# Patient Record
Sex: Female | Born: 1940 | Race: White | Hispanic: No | Smoking: Former smoker
Health system: Southern US, Community
[De-identification: ages and names within clinical notes are randomized; demographics above are authoritative.]

## PROBLEM LIST (undated history)

## (undated) DIAGNOSIS — D649 Anemia, unspecified: Secondary | ICD-10-CM

## (undated) DIAGNOSIS — R011 Cardiac murmur, unspecified: Secondary | ICD-10-CM

## (undated) DIAGNOSIS — I639 Cerebral infarction, unspecified: Secondary | ICD-10-CM

## (undated) DIAGNOSIS — A809 Acute poliomyelitis, unspecified: Secondary | ICD-10-CM

## (undated) DIAGNOSIS — F329 Major depressive disorder, single episode, unspecified: Secondary | ICD-10-CM

## (undated) DIAGNOSIS — M1990 Unspecified osteoarthritis, unspecified site: Secondary | ICD-10-CM

## (undated) DIAGNOSIS — I10 Essential (primary) hypertension: Secondary | ICD-10-CM

## (undated) DIAGNOSIS — R112 Nausea with vomiting, unspecified: Secondary | ICD-10-CM

## (undated) DIAGNOSIS — I6522 Occlusion and stenosis of left carotid artery: Secondary | ICD-10-CM

## (undated) DIAGNOSIS — I739 Peripheral vascular disease, unspecified: Secondary | ICD-10-CM

## (undated) DIAGNOSIS — R519 Headache, unspecified: Secondary | ICD-10-CM

## (undated) DIAGNOSIS — K219 Gastro-esophageal reflux disease without esophagitis: Secondary | ICD-10-CM

## (undated) DIAGNOSIS — F32A Depression, unspecified: Secondary | ICD-10-CM

## (undated) DIAGNOSIS — Z9889 Other specified postprocedural states: Secondary | ICD-10-CM

## (undated) DIAGNOSIS — I499 Cardiac arrhythmia, unspecified: Secondary | ICD-10-CM

## (undated) DIAGNOSIS — R51 Headache: Secondary | ICD-10-CM

## (undated) DIAGNOSIS — I447 Left bundle-branch block, unspecified: Secondary | ICD-10-CM

## (undated) DIAGNOSIS — Z973 Presence of spectacles and contact lenses: Secondary | ICD-10-CM

## (undated) DIAGNOSIS — E119 Type 2 diabetes mellitus without complications: Secondary | ICD-10-CM

## (undated) DIAGNOSIS — I720 Aneurysm of carotid artery: Secondary | ICD-10-CM

## (undated) DIAGNOSIS — I2510 Atherosclerotic heart disease of native coronary artery without angina pectoris: Secondary | ICD-10-CM

## (undated) HISTORY — PX: ABDOMINAL AORTIC ANEURYSM REPAIR: SUR1152

## (undated) HISTORY — PX: VASCULAR SURGERY: SHX849

## (undated) HISTORY — PX: RENAL ARTERY STENT: SHX2321

## (undated) HISTORY — PX: COLONOSCOPY: SHX174

## (undated) HISTORY — PX: CARDIAC CATHETERIZATION: SHX172

## (undated) HISTORY — PX: DILATION AND CURETTAGE OF UTERUS: SHX78

## (undated) HISTORY — PX: HERNIA REPAIR: SHX51

## (undated) HISTORY — PX: CHOLECYSTECTOMY: SHX55

---

## 2014-09-10 ENCOUNTER — Other Ambulatory Visit (HOSPITAL_COMMUNITY): Payer: Self-pay | Admitting: Interventional Radiology

## 2014-09-10 DIAGNOSIS — I729 Aneurysm of unspecified site: Secondary | ICD-10-CM

## 2014-09-21 ENCOUNTER — Other Ambulatory Visit (HOSPITAL_COMMUNITY): Payer: Self-pay | Admitting: Interventional Radiology

## 2014-09-21 DIAGNOSIS — I729 Aneurysm of unspecified site: Secondary | ICD-10-CM

## 2014-09-22 ENCOUNTER — Ambulatory Visit (HOSPITAL_COMMUNITY)
Admission: RE | Admit: 2014-09-22 | Discharge: 2014-09-22 | Disposition: A | Discharge status: Home / Self Care | Payer: Medicare Other | Source: Ambulatory Visit | Attending: Interventional Radiology | Admitting: Interventional Radiology

## 2014-09-22 DIAGNOSIS — I729 Aneurysm of unspecified site: Secondary | ICD-10-CM

## 2014-09-23 ENCOUNTER — Other Ambulatory Visit: Payer: Self-pay | Admitting: Radiology

## 2014-09-24 ENCOUNTER — Ambulatory Visit (HOSPITAL_COMMUNITY)
Admission: RE | Admit: 2014-09-24 | Discharge: 2014-09-24 | Disposition: A | Discharge status: Home / Self Care | Payer: Medicare Other | Source: Ambulatory Visit | Attending: Interventional Radiology | Admitting: Interventional Radiology

## 2014-09-24 ENCOUNTER — Encounter (HOSPITAL_COMMUNITY): Payer: Self-pay

## 2014-09-24 ENCOUNTER — Other Ambulatory Visit (HOSPITAL_COMMUNITY): Payer: Self-pay | Admitting: Interventional Radiology

## 2014-09-24 DIAGNOSIS — I739 Peripheral vascular disease, unspecified: Secondary | ICD-10-CM | POA: Diagnosis not present

## 2014-09-24 DIAGNOSIS — I10 Essential (primary) hypertension: Secondary | ICD-10-CM | POA: Diagnosis not present

## 2014-09-24 DIAGNOSIS — Z9049 Acquired absence of other specified parts of digestive tract: Secondary | ICD-10-CM | POA: Diagnosis not present

## 2014-09-24 DIAGNOSIS — Z87891 Personal history of nicotine dependence: Secondary | ICD-10-CM | POA: Diagnosis not present

## 2014-09-24 DIAGNOSIS — I6522 Occlusion and stenosis of left carotid artery: Secondary | ICD-10-CM | POA: Insufficient documentation

## 2014-09-24 DIAGNOSIS — K219 Gastro-esophageal reflux disease without esophagitis: Secondary | ICD-10-CM | POA: Diagnosis not present

## 2014-09-24 DIAGNOSIS — I2510 Atherosclerotic heart disease of native coronary artery without angina pectoris: Secondary | ICD-10-CM | POA: Insufficient documentation

## 2014-09-24 DIAGNOSIS — I729 Aneurysm of unspecified site: Secondary | ICD-10-CM

## 2014-09-24 DIAGNOSIS — I671 Cerebral aneurysm, nonruptured: Secondary | ICD-10-CM | POA: Diagnosis present

## 2014-09-24 DIAGNOSIS — Z7982 Long term (current) use of aspirin: Secondary | ICD-10-CM | POA: Diagnosis not present

## 2014-09-24 DIAGNOSIS — M1990 Unspecified osteoarthritis, unspecified site: Secondary | ICD-10-CM | POA: Diagnosis not present

## 2014-09-24 DIAGNOSIS — E119 Type 2 diabetes mellitus without complications: Secondary | ICD-10-CM | POA: Diagnosis not present

## 2014-09-24 DIAGNOSIS — A809 Acute poliomyelitis, unspecified: Secondary | ICD-10-CM | POA: Insufficient documentation

## 2014-09-24 HISTORY — DX: Depression, unspecified: F32.A

## 2014-09-24 HISTORY — DX: Cerebral infarction, unspecified: I63.9

## 2014-09-24 HISTORY — DX: Essential (primary) hypertension: I10

## 2014-09-24 HISTORY — DX: Atherosclerotic heart disease of native coronary artery without angina pectoris: I25.10

## 2014-09-24 HISTORY — DX: Gastro-esophageal reflux disease without esophagitis: K21.9

## 2014-09-24 HISTORY — DX: Unspecified osteoarthritis, unspecified site: M19.90

## 2014-09-24 HISTORY — DX: Acute poliomyelitis, unspecified: A80.9

## 2014-09-24 HISTORY — DX: Major depressive disorder, single episode, unspecified: F32.9

## 2014-09-24 HISTORY — DX: Peripheral vascular disease, unspecified: I73.9

## 2014-09-24 HISTORY — DX: Cardiac arrhythmia, unspecified: I49.9

## 2014-09-24 HISTORY — DX: Type 2 diabetes mellitus without complications: E11.9

## 2014-09-24 LAB — CBC WITH DIFFERENTIAL/PLATELET
Basophils Absolute: 0 10*3/uL (ref 0.0–0.1)
Basophils Relative: 1 % (ref 0–1)
Eosinophils Absolute: 0.2 10*3/uL (ref 0.0–0.7)
Eosinophils Relative: 4 % (ref 0–5)
HCT: 32.5 % — ABNORMAL LOW (ref 36.0–46.0)
Hemoglobin: 10.8 g/dL — ABNORMAL LOW (ref 12.0–15.0)
Lymphocytes Relative: 33 % (ref 12–46)
Lymphs Abs: 1.7 10*3/uL (ref 0.7–4.0)
MCH: 28.3 pg (ref 26.0–34.0)
MCHC: 33.2 g/dL (ref 30.0–36.0)
MCV: 85.1 fL (ref 78.0–100.0)
Monocytes Absolute: 0.3 10*3/uL (ref 0.1–1.0)
Monocytes Relative: 7 % (ref 3–12)
Neutro Abs: 3 10*3/uL (ref 1.7–7.7)
Neutrophils Relative %: 55 % (ref 43–77)
Platelets: 121 10*3/uL — ABNORMAL LOW (ref 150–400)
RBC: 3.82 MIL/uL — ABNORMAL LOW (ref 3.87–5.11)
RDW: 14.1 % (ref 11.5–15.5)
WBC: 5.2 10*3/uL (ref 4.0–10.5)

## 2014-09-24 LAB — BASIC METABOLIC PANEL
Anion gap: 13 (ref 5–15)
Anion gap: 11 (ref 5–15)
BUN: 16 mg/dL (ref 6–23)
BUN: 14 mg/dL (ref 6–23)
CO2: 24 mEq/L (ref 19–32)
CO2: 28 mEq/L (ref 19–32)
Calcium: 8.9 mg/dL (ref 8.4–10.5)
Calcium: 8.6 mg/dL (ref 8.4–10.5)
Chloride: 106 mEq/L (ref 96–112)
Chloride: 105 mEq/L (ref 96–112)
Creatinine, Ser: 0.78 mg/dL (ref 0.50–1.10)
Creatinine, Ser: 0.76 mg/dL (ref 0.50–1.10)
GFR calc Af Amer: >90 mL/min (ref >90)
GFR calc Af Amer: >90 mL/min (ref >90)
GFR calc non Af Amer: 82 mL/min — ABNORMAL LOW (ref >90)
GFR calc non Af Amer: 83 mL/min — ABNORMAL LOW (ref >90)
Glucose, Bld: 130 mg/dL — ABNORMAL HIGH (ref 70–99)
Glucose, Bld: 113 mg/dL — ABNORMAL HIGH (ref 70–99)
Potassium: 4 mEq/L (ref 3.7–5.3)
Potassium: 3.8 mEq/L (ref 3.7–5.3)
Sodium: 143 mEq/L (ref 137–147)
Sodium: 144 mEq/L (ref 137–147)

## 2014-09-24 LAB — PROTIME-INR
INR: 0.94 (ref 0.00–1.49)
INR: 1.02 (ref 0.00–1.49)
Prothrombin Time: 12.6 seconds (ref 11.6–15.2)
Prothrombin Time: 13.4 seconds (ref 11.6–15.2)

## 2014-09-24 LAB — APTT
aPTT: 20 seconds — ABNORMAL LOW (ref 24–37)
aPTT: 38 seconds — ABNORMAL HIGH (ref 24–37)

## 2014-09-24 LAB — CBC
HCT: 32.7 % — ABNORMAL LOW (ref 36.0–46.0)
Hemoglobin: 10.8 g/dL — ABNORMAL LOW (ref 12.0–15.0)
MCH: 28.8 pg (ref 26.0–34.0)
MCHC: 33 g/dL (ref 30.0–36.0)
MCV: 87.2 fL (ref 78.0–100.0)
Platelets: 140 10*3/uL — ABNORMAL LOW (ref 150–400)
RBC: 3.75 MIL/uL — ABNORMAL LOW (ref 3.87–5.11)
RDW: 14.1 % (ref 11.5–15.5)
WBC: 5.3 10*3/uL (ref 4.0–10.5)

## 2014-09-24 LAB — GLUCOSE, CAPILLARY
Glucose-Capillary: 144 mg/dL — ABNORMAL HIGH (ref 70–99)
Glucose-Capillary: 106 mg/dL — ABNORMAL HIGH (ref 70–99)

## 2014-09-24 MED ORDER — IOHEXOL 300 MG/ML  SOLN
150.0000 mL | Freq: Once | INTRAMUSCULAR | Status: AC | PRN
  Administered 2014-09-24: 80 mL via INTRAVENOUS

## 2014-09-24 MED ORDER — SODIUM CHLORIDE 0.9 % IV SOLN
INTRAVENOUS | Status: AC | PRN
  Administered 2014-09-24: 200 mL via INTRAVENOUS

## 2014-09-24 MED ORDER — HEPARIN SODIUM (PORCINE) 1000 UNIT/ML IJ SOLN
INTRAMUSCULAR | Status: AC
  Filled 2014-09-24: qty 1

## 2014-09-24 MED ORDER — MIDAZOLAM HCL 2 MG/2ML IJ SOLN
INTRAMUSCULAR | Status: AC
  Filled 2014-09-24: qty 2

## 2014-09-24 MED ORDER — FENTANYL CITRATE 0.05 MG/ML IJ SOLN
INTRAMUSCULAR | Status: AC
  Filled 2014-09-24: qty 2

## 2014-09-24 MED ORDER — SODIUM CHLORIDE 0.9 % IV SOLN
INTRAVENOUS | Status: DC
  Administered 2014-09-24: 1000 mL via INTRAVENOUS

## 2014-09-24 MED ORDER — FENTANYL CITRATE 0.05 MG/ML IJ SOLN
INTRAMUSCULAR | Status: AC | PRN
  Administered 2014-09-24: 25 ug via INTRAVENOUS

## 2014-09-24 MED ORDER — LIDOCAINE HCL 1 % IJ SOLN
INTRAMUSCULAR | Status: AC
  Filled 2014-09-24: qty 20

## 2014-09-24 MED ORDER — MIDAZOLAM HCL 2 MG/2ML IJ SOLN
INTRAMUSCULAR | Status: AC | PRN
  Administered 2014-09-24: 1 mg via INTRAVENOUS

## 2014-09-24 MED ORDER — SODIUM CHLORIDE 0.9 % IV SOLN: INTRAVENOUS | Status: DC

## 2014-09-24 MED ORDER — HEPARIN SODIUM (PORCINE) 1000 UNIT/ML IJ SOLN
INTRAMUSCULAR | Status: AC | PRN
  Administered 2014-09-24: 500 [IU] via INTRAVENOUS
  Administered 2014-09-24: 1000 [IU] via INTRAVENOUS

## 2014-09-24 NOTE — Sedation Documentation (Signed)
Lab called at 1007 to inform that lab was clotted and would like to redraw. Informed Dr. Corliss Skainseveshwar and he ordered to draw PTT, INR, cbc, chem 7.

## 2014-09-24 NOTE — Procedures (Signed)
S/P 4 vessel cerebral arteriogram. RT CFA approach. Findings. 1.approx 3.26mm x 3.2 mm Lt ICA intracrania; pCom aneurysm. Lt CCA artery occluded.Marland Kitchen. CCA graft distal to origin of LT VA  Severe narrowing of Lt VA origin. 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 Severe stenosis Lt SCA prox to graft.

## 2014-09-24 NOTE — Sedation Documentation (Signed)
Patient is resting comfortably. 

## 2014-09-24 NOTE — Progress Notes (Signed)
PAM TURPIN,PA NOTIFIED OF OOZING RIGHT GROIN; PER PAM WILL KEEP ON BEDREST X 1HR AND IF NO FURTHER OOZING MAY D/C HOME

## 2014-09-24 NOTE — H&P (Signed)
Chief Complaint: L eye amaurosis fugax  Referring Physician(s): Dr Sondra Comeruz  History of Present Illness: Jasmine HawthorneMary J Niess is a 73 y.o. 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 Coronary artery disease   . Hypertension   . Dysrhythmia   . Stroke   . Peripheral vascular disease   . Diabetes mellitus without complication   . Depression   . GERD (gastroesophageal reflux disease)   . Arthritis   . Polio     Past Surgical History  Procedure Laterality Date  . Vascular surgery    . Cardiac catheterization    . Cholecystectomy    . Abdominal aortic aneurysm repair      Allergies: Amoxicillin; Codeine; Iron; Penicillins; and Tape  Medications: Prior to Admission medications   Medication Sig Start Date End Date Taking? Authorizing Provider  aspirin 81 MG tablet Take 81 mg by mouth daily.   Yes Historical Provider, MD  calcium carbonate (OS-CAL) 600 MG TABS tablet Take 600 mg by mouth 2 (two) times daily with a meal.   Yes Historical Provider, MD  Doxylamine Succinate, Sleep, (SLEEP AID PO) Take 1 tablet by mouth at bedtime.   Yes Historical Provider, MD  Omega-3 Fatty Acids (FISH OIL PO) Take 1 capsule by mouth 2 (two) times daily.   Yes Historical Provider, MD    History reviewed. No pertinent family history.  History   Social History  . Marital Status: Married    Spouse Name: N/A    Number of Children: N/A  . Years of Education: N/A   Social History Main Topics  . Smoking status: Former Games developermoker  . Smokeless tobacco:  None  . Alcohol Use: None  . Drug Use: None  . Sexual Activity: None   Other Topics Concern  . None   Social History Narrative  . None    Review of Systems: A 12 point ROS discussed and pertinent positives are indicated in the HPI above.  All other systems are negative.  Review of Systems  Constitutional: Negative for fever, appetite change and unexpected weight change.  Eyes: Positive for visual disturbance.       Lt eye amaurosis fugax  Respiratory: Negative for cough and shortness of breath.   Cardiovascular: Negative for chest pain.  Gastrointestinal: Negative for nausea, vomiting and abdominal pain.  Musculoskeletal: Negative for back pain.  Skin: Negative for color change.  Neurological: Negative for dizziness, tremors, seizures, syncope, speech difficulty, weakness, numbness and headaches.  Psychiatric/Behavioral: Negative for behavioral problems and confusion.    Vital Signs: BP 124/54  Pulse 70  Temp(Src) 97.5 F (36.4 C) (Oral)  Resp 20  Ht 5\' 5"  (1.651 m)  Wt 72.576 kg (160 lb)  BMI 26.63 kg/m2  SpO2 97%  Physical Exam  Constitutional: She is oriented to person, place, and time. She appears well-nourished.  Cardiovascular: Normal rate, regular rhythm and normal heart sounds.   No murmur heard. Pulmonary/Chest: Effort normal and breath sounds normal. No respiratory distress. She has no wheezes.  Abdominal: Soft. Bowel sounds are normal. She exhibits no  distension. There is no tenderness.  Musculoskeletal: Normal range of motion.  Neurological: She is alert and oriented to person, place, and time.  Skin: Skin is warm and dry.  Psychiatric: She has a normal mood and affect. Her behavior is normal. Judgment and thought content normal.    Imaging: Ir Radiologist Eval & Mgmt  09/23/2014   EXAM: NEW PATIENT OFFICE VISIT  CHIEF COMPLAINT: Left-sided amaurosis fugax. Left internal carotid artery intracranial aneurysm.  Current Pain Level: 1-10  HISTORY OF PRESENT  ILLNESS: The patient is a 73 year old right-handed lady who has been referred for the management of the finding of an aneurysm involving the left internal carotid artery intracranially.  The patient is accompanied by her daughter.  The patient reportedly woke up around September 3rd with left eye blurring which she describes as though looking through a lacy curtain. The left eye visual haziness has been slow to improve to were at the present time, she reports clear vision in her periphery with centrally area of haziness persistent.  She denies any associated pain double vision, speech difficulties, right-sided motor or sensory symptoms or difficulty with gait or coordination.  This apparently is despite the fact that the patient is on aspirin and Plavix.  The patient reports a similar episode in 2011 again also involving the left eye despite being on aspirin and Plavix at that time. Those symptoms cleared over a few days at that time.  The patient recently underwent a CT angiogram of her neck and head. This apparently revealed the presence of an approximately 4 mm left internal carotid artery intracranial aneurysm. Additionally noted was suggestion of significant stenosis in the cavernous region of the left internal carotid artery.  Past Medical History: Arthritis. Depression. Diabetes for at least 30 years. Heart problems. High blood pressure. High cholesterol. Reflux indigestion. History of stroke. Severe peripheral vascular disease. History of polio affecting the right side of her face.  Past Surgeries: Abdominal aortic aneurysm repair. Cholecystectomy. Colonoscopy. Bilateral aortofemoral bypasses. Left carotid stent placement.  Medications: Albuterol sulfate. Aspirin. Atorvastatin. Citalopram. Plavix. Gabapentin. Losartan hydrochlorothiazide. Metformin. Metoprolol. Multivitamins. Pantoprazole. Potassium chloride tablets. Calcium supplements. Fish oil.  Allergies: Codeine causes itching and rash. Iron causes  diarrhea. Amoxil and penicillin causes itching and rash.  Social History: Married. Has 4 healthy children. Denies drinking alcohol. Stopped smoking at age 63. Denies use of illicit chemicals.  Family History: Asthma. Brain tumor in an aunt. Diabetes mellitus. Headaches. Heart problems. Father died of an MI. Brother died of an MI also. Another brother died of lung carcinoma, had Wilson's disease. Sister with Wilson's disease, died of cirrhosis. A brother died of seizure.  REVIEW OF SYSTEMS: Occasional intermittent claudication like symptoms in the left lower extremity. Has poor exercise tolerance.  However denies any chest pain, shortness of breath, ankle swelling or paroxysmal nocturnal dyspnea. Occasional wheezing with coughing of spit. Denies hemoptysis.  PHYSICAL EXAMINATION: Briefly patient in no acute distress.  Affect normal.  Neurologically alert, awake, oriented to time, place, space. Speech and comprehension normal.  Unable to close her right eye. Visual acuity: Right eye, patient able to read large letters at approximately 10 feet. Left eye, barely able to discern letters in the central visual field.  No other lateralizing features in terms of cranial nerve abnormalities. Motor and sensory exam and coordination within normal limits.  Romberg's negative.  Patient unable to walk heel to toe.  ASSESSMENT AND PLAN: The recent CT angiogram of the head and neck was reviewed with the  patient and the patient's daughter. Extracranially suggestion of bilateral subclavian passes, and of placement of an innominate stent, and a left carotid stent proximally is noted  Intracranially there appears to be cavernous calcifications bilaterally worse on the left side. In the region of the posterior communicating artery, a saccular out pouching is seen projecting medially measuring approximately 4 to 5 mm. This appears to be at the level of the ophthalmic artery. Also noted is a more proximal cavernous segment stenoses.  The  natural history of intracranial aneurysms was reviewed with the patient and the daughter. Risks of intracranial rupture of 1-2% per year with attendant morbidity and mortality were discussed. It is unclear as whether the intracranial aneurysm is the etiology of her left-sided amaurosis fugax as described above. However more contributing factor might be the more proximal suspected narrowing of the cavernous segment of the left internal carotid artery.  In order to more accurately define the suspected abnormalities, it was felt the patient would need catheter angiogram. The procedure, risks, benefits, alternatives were all reviewed in detail.  The patient and her daughter were informed of the potential challenges entailed with the catheter angiogram given the patient's history of aortofemoral bypass, and also of surgeries involving the subclavian bypasses, and also of the placement of a stent in the left common carotid, and possibly the innominate artery. The patient and daughter would like to proceed with the catheter angiogram. This will be undertaken in the near future. Questions were answered to her satisfaction. Patient and her daughter leave with good understanding and agreement with the above management plan.   Electronically Signed   By: Julieanne Cotton M.D.   On: 09/22/2014 15:04    Labs: Lab Results  Component Value Date   WBC 5.2 09/24/2014   HCT 32.5* 09/24/2014   MCV 85.1 09/24/2014   PLT 121* 09/24/2014   INR 0.94 09/24/2014    Assessment and Plan:  L amaurosis fugax- work up included MRI head L ICA stenosis; PCOM aneurysm noted Pt with hx L carotid stent; B aortafem bypass Now scheduled for cerebral arteriogram for eval of aneurysm Pt and family aware of procedure benefits and risks and agreeable to proceed Consent signed and in chart   Thank you for this interesting consult.  I greatly enjoyed meeting MARLAYSIA LENIG and look forward to participating in their care..    I spent a  total of 20 minutes face to face in clinical consultation, greater than 50% of which was counseling/coordinating care for cerebral arteriogram  Signed: Sumner Kirchman A 09/24/2014, 8:17 AM

## 2014-09-24 NOTE — Sedation Documentation (Signed)
Labs redrawn prior to transfer to short stay.

## 2014-09-24 NOTE — Sedation Documentation (Signed)
Dr. Corliss Skainseveshwar in room s/w pt/family regarding procedure outcome.  Questions answered.

## 2014-09-24 NOTE — Sedation Documentation (Signed)
Sheath removed and pressure held at site. Per Elnita Maxwellheryl Dette RT

## 2014-09-24 NOTE — Sedation Documentation (Signed)
Patient denies pain.

## 2014-09-24 NOTE — Discharge Instructions (Signed)

## 2014-09-28 ENCOUNTER — Other Ambulatory Visit (HOSPITAL_COMMUNITY): Payer: Self-pay | Admitting: Interventional Radiology

## 2014-09-28 DIAGNOSIS — I729 Aneurysm of unspecified site: Secondary | ICD-10-CM

## 2014-09-28 DIAGNOSIS — I771 Stricture of artery: Secondary | ICD-10-CM

## 2014-09-29 ENCOUNTER — Ambulatory Visit (HOSPITAL_COMMUNITY)
Admission: RE | Admit: 2014-09-29 | Discharge: 2014-09-29 | Disposition: A | Discharge status: Home / Self Care | Payer: Medicare Other | Source: Ambulatory Visit | Attending: Interventional Radiology | Admitting: Interventional Radiology

## 2014-09-29 DIAGNOSIS — I729 Aneurysm of unspecified site: Secondary | ICD-10-CM

## 2014-09-29 DIAGNOSIS — I771 Stricture of artery: Secondary | ICD-10-CM

## 2014-10-06 ENCOUNTER — Telehealth (HOSPITAL_COMMUNITY): Payer: Self-pay | Admitting: Interventional Radiology

## 2014-10-06 NOTE — Telephone Encounter (Signed)
Returned pt's call, left her a VM to call me back. JM

## 2014-10-27 ENCOUNTER — Other Ambulatory Visit (HOSPITAL_COMMUNITY): Payer: Self-pay | Admitting: Interventional Radiology

## 2014-10-27 ENCOUNTER — Telehealth (HOSPITAL_COMMUNITY): Payer: Self-pay | Admitting: Interventional Radiology

## 2014-10-27 DIAGNOSIS — I729 Aneurysm of unspecified site: Secondary | ICD-10-CM

## 2014-10-27 NOTE — Telephone Encounter (Signed)
Called pt, left VM for her to call to schedule intervention. JM 

## 2014-10-28 ENCOUNTER — Telehealth (HOSPITAL_COMMUNITY): Payer: Self-pay | Admitting: Interventional Radiology

## 2014-10-28 NOTE — Telephone Encounter (Signed)
Called pt, left VM for her to call to schedule procedure. JM 

## 2014-11-12 ENCOUNTER — Other Ambulatory Visit: Payer: Self-pay | Admitting: Radiology

## 2014-11-13 ENCOUNTER — Other Ambulatory Visit: Payer: Self-pay | Admitting: Radiology

## 2014-11-16 NOTE — Pre-Procedure Instructions (Addendum)
Jasmine HawthorneMary J Moon  11/16/2014   Your procedure is scheduled on:  11/30/14  [facility] short stay admitting at 600  AM.  Call this number if you have problems the morning of surgery: [phone]   Remember:   Do not eat food or drink liquids after midnight.   Take these medicines the morning of surgery with A SIP OF WATER :Aspirin, Plavix, Celexa, Metoprolol, Protonix   Take all meds as ordered until day of surgery except as instructed below or per dr   Despina AriasSTOP all herbel meds, nsaids (aleve,naproxen,advil,ibuprofen) 5 days prior to surgery(11/25/14( including vitamins,fish oil    No metformin day of surgery   Do not wear jewelry, make-up or nail polish.  Do not wear lotions, powders, or perfumes. You may wear deodorant.  Do not shave 48 hours prior to surgery. Men may shave face and neck.  Do not bring valuables to the hospital.  [facility] is not responsible                  for any belongings or valuables.               Contacts, dentures or bridgework may not be worn into surgery.  Leave suitcase in the car. After surgery it may be brought to your room.  For patients admitted to the hospital, discharge time is determined by your                treatment team.               Patients discharged the day of surgery will not be allowed to drive  home.  Name and phone number of your driver:   Special Instructions:  Special Instructions: [place] - Preparing for Surgery  Before surgery, you can play an important role.  Because skin is not sterile, your skin needs to be as free of germs as possible.  You can reduce the number of germs on you skin by washing with CHG (chlorahexidine gluconate) soap before surgery.  CHG is an antiseptic cleaner which kills germs and bonds with the skin to continue killing germs even after washing.  Please DO NOT use if you have an allergy to CHG or antibacterial soaps.  If your skin becomes reddened/irritated stop using the CHG and inform your nurse  when you arrive at Short Stay.  Do not shave (including legs and underarms) for at least 48 hours prior to the first CHG shower.  You may shave your face.  Please follow these instructions carefully:   1.  Shower with CHG Soap the night before surgery and the morning of Surgery.  2.  If you choose to wash your hair, wash your hair first as usual with your normal shampoo.  3.  After you shampoo, rinse your hair and body thoroughly to remove the Shampoo.  4.  Use CHG as you would any other liquid soap.  You can apply chg directly  to the skin and wash gently with scrungie or a clean washcloth.  5.  Apply the CHG Soap to your body ONLY FROM THE NECK DOWN.  Do not use on open wounds or open sores.  Avoid contact with your eyes ears, mouth and genitals (private parts).  Wash genitals (private parts)       with your normal soap.  6.  Wash thoroughly, paying special attention to the area where your surgery will be performed.  7.  Thoroughly rinse your body with  warm water from the neck down.  8.  DO NOT shower/wash with your normal soap after using and rinsing off the CHG Soap.  9.  Pat yourself dry with a clean towel.            10.  Wear clean pajamas.            11.  Place clean sheets on your bed the night of your first shower and do not sleep with pets.  Day of Surgery  Do not apply any lotions/deodorants the morning of surgery.  Please wear clean clothes to the hospital/surgery center.   Please read over the following fact sheets that you were given: Pain Booklet, Coughing and Deep Breathing and Surgical Site Infection Prevention

## 2014-11-17 ENCOUNTER — Encounter (HOSPITAL_COMMUNITY): Payer: Self-pay

## 2014-11-17 ENCOUNTER — Encounter (HOSPITAL_COMMUNITY)
Admission: RE | Admit: 2014-11-17 | Discharge: 2014-11-17 | Disposition: A | Discharge status: Home / Self Care | Payer: Medicare Other | Source: Ambulatory Visit | Attending: Interventional Radiology | Admitting: Interventional Radiology

## 2014-11-17 DIAGNOSIS — I340 Nonrheumatic mitral (valve) insufficiency: Secondary | ICD-10-CM | POA: Insufficient documentation

## 2014-11-17 DIAGNOSIS — K219 Gastro-esophageal reflux disease without esophagitis: Secondary | ICD-10-CM | POA: Diagnosis not present

## 2014-11-17 DIAGNOSIS — D509 Iron deficiency anemia, unspecified: Secondary | ICD-10-CM | POA: Insufficient documentation

## 2014-11-17 DIAGNOSIS — I6522 Occlusion and stenosis of left carotid artery: Secondary | ICD-10-CM | POA: Diagnosis not present

## 2014-11-17 DIAGNOSIS — I517 Cardiomegaly: Secondary | ICD-10-CM | POA: Insufficient documentation

## 2014-11-17 DIAGNOSIS — E119 Type 2 diabetes mellitus without complications: Secondary | ICD-10-CM | POA: Insufficient documentation

## 2014-11-17 DIAGNOSIS — I447 Left bundle-branch block, unspecified: Secondary | ICD-10-CM | POA: Diagnosis not present

## 2014-11-17 DIAGNOSIS — I071 Rheumatic tricuspid insufficiency: Secondary | ICD-10-CM | POA: Diagnosis not present

## 2014-11-17 DIAGNOSIS — Z9049 Acquired absence of other specified parts of digestive tract: Secondary | ICD-10-CM | POA: Insufficient documentation

## 2014-11-17 DIAGNOSIS — G453 Amaurosis fugax: Secondary | ICD-10-CM | POA: Diagnosis not present

## 2014-11-17 DIAGNOSIS — F329 Major depressive disorder, single episode, unspecified: Secondary | ICD-10-CM | POA: Insufficient documentation

## 2014-11-17 DIAGNOSIS — M1990 Unspecified osteoarthritis, unspecified site: Secondary | ICD-10-CM | POA: Diagnosis not present

## 2014-11-17 DIAGNOSIS — I2510 Atherosclerotic heart disease of native coronary artery without angina pectoris: Secondary | ICD-10-CM | POA: Insufficient documentation

## 2014-11-17 DIAGNOSIS — I739 Peripheral vascular disease, unspecified: Secondary | ICD-10-CM | POA: Diagnosis not present

## 2014-11-17 DIAGNOSIS — R011 Cardiac murmur, unspecified: Secondary | ICD-10-CM | POA: Diagnosis not present

## 2014-11-17 DIAGNOSIS — Z01818 Encounter for other preprocedural examination: Secondary | ICD-10-CM | POA: Insufficient documentation

## 2014-11-17 DIAGNOSIS — A809 Acute poliomyelitis, unspecified: Secondary | ICD-10-CM | POA: Insufficient documentation

## 2014-11-17 DIAGNOSIS — Z95828 Presence of other vascular implants and grafts: Secondary | ICD-10-CM | POA: Diagnosis not present

## 2014-11-17 DIAGNOSIS — I69398 Other sequelae of cerebral infarction: Secondary | ICD-10-CM | POA: Insufficient documentation

## 2014-11-17 DIAGNOSIS — Z7982 Long term (current) use of aspirin: Secondary | ICD-10-CM | POA: Insufficient documentation

## 2014-11-17 DIAGNOSIS — Z87891 Personal history of nicotine dependence: Secondary | ICD-10-CM | POA: Diagnosis not present

## 2014-11-17 DIAGNOSIS — Z79899 Other long term (current) drug therapy: Secondary | ICD-10-CM | POA: Insufficient documentation

## 2014-11-17 DIAGNOSIS — I499 Cardiac arrhythmia, unspecified: Secondary | ICD-10-CM | POA: Insufficient documentation

## 2014-11-17 DIAGNOSIS — I10 Essential (primary) hypertension: Secondary | ICD-10-CM | POA: Diagnosis not present

## 2014-11-17 DIAGNOSIS — I671 Cerebral aneurysm, nonruptured: Secondary | ICD-10-CM | POA: Insufficient documentation

## 2014-11-17 HISTORY — DX: Anemia, unspecified: D64.9

## 2014-11-17 HISTORY — DX: Other specified postprocedural states: Z98.890

## 2014-11-17 HISTORY — DX: Cardiac murmur, unspecified: R01.1

## 2014-11-17 HISTORY — DX: Other specified postprocedural states: R11.2

## 2014-11-17 HISTORY — DX: Left bundle-branch block, unspecified: I44.7

## 2014-11-17 LAB — CBC WITH DIFFERENTIAL/PLATELET
Basophils Absolute: 0 10*3/uL (ref 0.0–0.1)
Basophils Relative: 1 % (ref 0–1)
Eosinophils Absolute: 0.2 10*3/uL (ref 0.0–0.7)
Eosinophils Relative: 3 % (ref 0–5)
HCT: 34.2 % — ABNORMAL LOW (ref 36.0–46.0)
Hemoglobin: 11.2 g/dL — ABNORMAL LOW (ref 12.0–15.0)
Lymphocytes Relative: 26 % (ref 12–46)
Lymphs Abs: 1.5 10*3/uL (ref 0.7–4.0)
MCH: 28.4 pg (ref 26.0–34.0)
MCHC: 32.7 g/dL (ref 30.0–36.0)
MCV: 86.8 fL (ref 78.0–100.0)
Monocytes Absolute: 0.3 10*3/uL (ref 0.1–1.0)
Monocytes Relative: 5 % (ref 3–12)
Neutro Abs: 3.9 10*3/uL (ref 1.7–7.7)
Neutrophils Relative %: 65 % (ref 43–77)
Platelets: 159 10*3/uL (ref 150–400)
RBC: 3.94 MIL/uL (ref 3.87–5.11)
RDW: 13.6 % (ref 11.5–15.5)
WBC: 5.9 10*3/uL (ref 4.0–10.5)

## 2014-11-17 LAB — BASIC METABOLIC PANEL
Anion gap: 14 (ref 5–15)
BUN: 10 mg/dL (ref 6–23)
CO2: 25 mEq/L (ref 19–32)
Calcium: 9.5 mg/dL (ref 8.4–10.5)
Chloride: 102 mEq/L (ref 96–112)
Creatinine, Ser: 0.74 mg/dL (ref 0.50–1.10)
GFR calc Af Amer: >90 mL/min (ref >90)
GFR calc non Af Amer: 84 mL/min — ABNORMAL LOW (ref >90)
Glucose, Bld: 114 mg/dL — ABNORMAL HIGH (ref 70–99)
Potassium: 3.9 mEq/L (ref 3.7–5.3)
Sodium: 141 mEq/L (ref 137–147)

## 2014-11-17 LAB — PROTIME-INR
INR: 1 (ref 0.00–1.49)
Prothrombin Time: 13.3 seconds (ref 11.6–15.2)

## 2014-11-17 LAB — APTT: aPTT: 33 seconds (ref 24–37)

## 2014-11-17 LAB — PLATELET INHIBITION P2Y12: Platelet Function  P2Y12: 165 [PRU] — ABNORMAL LOW (ref 194–418)

## 2014-11-17 NOTE — Progress Notes (Signed)
PCP is Dr Derrell LollingJobe Cardiologist is Dr Jacinto HalimGanji. Requested Last office visit, EKG, Stress test, and Echo.  Pt states that she has been on Plavix for many years (probably 20 years). Reports CBG's run in the 130's fasting.

## 2014-11-18 ENCOUNTER — Encounter (HOSPITAL_COMMUNITY): Payer: Self-pay

## 2014-11-18 NOTE — Progress Notes (Signed)
Anesthesia Chart Review:  Patient is a 73 year old female scheduled for cerebral arteriogram with aneurysm embolization on 11/30/14 by Dr. Corliss Skainseveshwar. 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  Other history includes former smoker, CAD, dysrhythmia (not specified; has left BBB), murmur (mild MR/TR 09/2014 echo), HTN, PAD s/p AFBG (AAA repair), renal artery stent, CVA (left eye amaurosis fugax), DM2, GERD, polio, iron deficiency anemia, depression, arthritis, cholecystectomy, post-operative N/V. Previous IR notes also indicate a history of left carotid stent and subclavian bypass with innominate stent. Most of [facility] vascular [facility]. PCP is Dr. Synetta Failaniel Jobe.  Cardiologist is Dr. Jacinto HalimGanji (sent by IR for a preoperative evaluation).    Meds: Calcium with D, doxylamine succinate, Fish oil, ASA 81mg , Lipitor, Celexa, Plavix, Neurontin, Isopto tears, Hyzaar, Fortamet, Toprol XL, Protonix, KCl.  EKG on 10/16/14 [facility]): SB, LAE, left BBB.   Nuclear stress test on 10/21/14: Resting ECG demonstrates normal sinus rhythm, left bundle branch block. The perfusion imaging study demonstrated minimal septal thickening which can be seen in left bundle branch block, but otherwise no statistically significant ischemia. LVEF is 69%. This is a low risk study. Based on this study patient can be taken up for the upcoming neuro endovascular procedure with acceptable cardiovascular risk (Dr. Jacinto HalimGanji).  Echo on 10/22/14 [facility]): Left ventricle cavity is normal in size. Moderate concentric hypertrophy. Normal global wall motion. Visual EF 65-70%. Mildly dilated left atrial cavity. Mild mitral regurgitation. Mild tricuspid regurgitation. No evidence of pulmonary hypertension.  There was no prior cardiac cath at [facility].   Cerebral angiogram on 09/24/14: Findings: Approx 3.946mm x 3.2 mm Lt ICA intracrania; pCom aneurysm. Lt CCA artery occluded. CCA graft distal to origin of LT  VA. Severe narrowing of Lt VA origin. Occluded LT VBJ. 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 Severe stenosis Lt SCA prox to graft.  Preoperative labs noted.  A1C on 11/04/14 was 6.5.  Patient with cardiac clearance. If no acute changes then I anticipate that she can proceed as planned from an anesthesiology standpoint.  Velna Ochsllison Zelenak, PA-C [facility]/Anesthesiology Phone [phone] 11/18/2014 2:19 PM

## 2014-11-27 ENCOUNTER — Other Ambulatory Visit: Payer: Self-pay | Admitting: Radiology

## 2014-11-30 ENCOUNTER — Ambulatory Visit (HOSPITAL_COMMUNITY): Payer: Medicare Other | Admitting: Vascular Surgery

## 2014-11-30 ENCOUNTER — Encounter (HOSPITAL_COMMUNITY)
Admission: RE | Disposition: A | Discharge status: Home / Self Care | Payer: Self-pay | Source: Ambulatory Visit | Attending: Interventional Radiology

## 2014-11-30 ENCOUNTER — Ambulatory Visit (HOSPITAL_COMMUNITY)
Admission: RE | Admit: 2014-11-30 | Discharge: 2014-11-30 | Disposition: A | Discharge status: Home / Self Care | Payer: Medicare Other | Source: Ambulatory Visit | Attending: Interventional Radiology | Admitting: Interventional Radiology

## 2014-11-30 ENCOUNTER — Encounter (HOSPITAL_COMMUNITY): Payer: Self-pay

## 2014-11-30 ENCOUNTER — Encounter (HOSPITAL_COMMUNITY): Payer: Self-pay | Admitting: *Deleted

## 2014-11-30 ENCOUNTER — Ambulatory Visit (HOSPITAL_COMMUNITY): Payer: Medicare Other | Admitting: Certified Registered Nurse Anesthetist

## 2014-11-30 DIAGNOSIS — Z8673 Personal history of transient ischemic attack (TIA), and cerebral infarction without residual deficits: Secondary | ICD-10-CM | POA: Insufficient documentation

## 2014-11-30 DIAGNOSIS — I10 Essential (primary) hypertension: Secondary | ICD-10-CM | POA: Diagnosis not present

## 2014-11-30 DIAGNOSIS — T82858A Stenosis of vascular prosthetic devices, implants and grafts, initial encounter: Secondary | ICD-10-CM | POA: Insufficient documentation

## 2014-11-30 DIAGNOSIS — Z8612 Personal history of poliomyelitis: Secondary | ICD-10-CM | POA: Insufficient documentation

## 2014-11-30 DIAGNOSIS — Y831 Surgical operation with implant of artificial internal device as the cause of abnormal reaction of the patient, or of later complication, without mention of misadventure at the time of the procedure: Secondary | ICD-10-CM | POA: Insufficient documentation

## 2014-11-30 DIAGNOSIS — Z87891 Personal history of nicotine dependence: Secondary | ICD-10-CM | POA: Insufficient documentation

## 2014-11-30 DIAGNOSIS — H538 Other visual disturbances: Secondary | ICD-10-CM | POA: Insufficient documentation

## 2014-11-30 DIAGNOSIS — I729 Aneurysm of unspecified site: Secondary | ICD-10-CM

## 2014-11-30 DIAGNOSIS — R110 Nausea: Secondary | ICD-10-CM | POA: Insufficient documentation

## 2014-11-30 DIAGNOSIS — K219 Gastro-esophageal reflux disease without esophagitis: Secondary | ICD-10-CM | POA: Insufficient documentation

## 2014-11-30 DIAGNOSIS — Z7982 Long term (current) use of aspirin: Secondary | ICD-10-CM | POA: Insufficient documentation

## 2014-11-30 DIAGNOSIS — I6522 Occlusion and stenosis of left carotid artery: Secondary | ICD-10-CM | POA: Insufficient documentation

## 2014-11-30 DIAGNOSIS — I671 Cerebral aneurysm, nonruptured: Secondary | ICD-10-CM | POA: Diagnosis not present

## 2014-11-30 DIAGNOSIS — I739 Peripheral vascular disease, unspecified: Secondary | ICD-10-CM | POA: Insufficient documentation

## 2014-11-30 DIAGNOSIS — Z7902 Long term (current) use of antithrombotics/antiplatelets: Secondary | ICD-10-CM | POA: Insufficient documentation

## 2014-11-30 DIAGNOSIS — E119 Type 2 diabetes mellitus without complications: Secondary | ICD-10-CM | POA: Insufficient documentation

## 2014-11-30 HISTORY — PX: RADIOLOGY WITH ANESTHESIA: SHX6223

## 2014-11-30 LAB — PLATELET INHIBITION P2Y12: Platelet Function  P2Y12: 140 [PRU] — ABNORMAL LOW (ref 194–418)

## 2014-11-30 LAB — GLUCOSE, CAPILLARY
Glucose-Capillary: 119 mg/dL — ABNORMAL HIGH (ref 70–99)
Glucose-Capillary: 108 mg/dL — ABNORMAL HIGH (ref 70–99)

## 2014-11-30 SURGERY — RADIOLOGY WITH ANESTHESIA
Anesthesia: General

## 2014-11-30 MED ORDER — DEXAMETHASONE SODIUM PHOSPHATE 4 MG/ML IJ SOLN
INTRAMUSCULAR | Status: DC | PRN
  Administered 2014-11-30: 4 mg via INTRAVENOUS

## 2014-11-30 MED ORDER — LIDOCAINE HCL 1 % IJ SOLN
INTRAMUSCULAR | Status: AC
  Filled 2014-11-30: qty 20

## 2014-11-30 MED ORDER — NEOSTIGMINE METHYLSULFATE 10 MG/10ML IV SOLN
INTRAVENOUS | Status: DC | PRN
  Administered 2014-11-30: 3 mg via INTRAVENOUS

## 2014-11-30 MED ORDER — FENTANYL CITRATE 0.05 MG/ML IJ SOLN
INTRAMUSCULAR | Status: DC | PRN
  Administered 2014-11-30: 75 ug via INTRAVENOUS

## 2014-11-30 MED ORDER — ASPIRIN EC 325 MG PO TBEC
325.0000 mg | DELAYED_RELEASE_TABLET | ORAL | Status: DC
  Filled 2014-11-30: qty 1

## 2014-11-30 MED ORDER — ONDANSETRON HCL 4 MG/2ML IJ SOLN
INTRAMUSCULAR | Status: DC | PRN
  Administered 2014-11-30: 4 mg via INTRAVENOUS

## 2014-11-30 MED ORDER — PROPOFOL 10 MG/ML IV BOLUS
INTRAVENOUS | Status: DC | PRN
  Administered 2014-11-30: 80 mg via INTRAVENOUS

## 2014-11-30 MED ORDER — PROMETHAZINE HCL 25 MG/ML IJ SOLN: 6.2500 mg | INTRAMUSCULAR | Status: DC | PRN

## 2014-11-30 MED ORDER — IOHEXOL 300 MG/ML  SOLN
150.0000 mL | Freq: Once | INTRAMUSCULAR | Status: AC | PRN
  Administered 2014-11-30: 70 mL via INTRAVENOUS

## 2014-11-30 MED ORDER — NIMODIPINE 30 MG PO CAPS
60.0000 mg | ORAL_CAPSULE | ORAL | Status: DC
  Filled 2014-11-30 (×2): qty 2

## 2014-11-30 MED ORDER — SODIUM CHLORIDE 0.9 % IV SOLN
INTRAVENOUS | Status: DC
  Administered 2014-11-30 (×2): via INTRAVENOUS

## 2014-11-30 MED ORDER — GLYCOPYRROLATE 0.2 MG/ML IJ SOLN
INTRAMUSCULAR | Status: DC | PRN
  Administered 2014-11-30 (×2): 0.1 mg via INTRAVENOUS
  Administered 2014-11-30: 0.4 mg via INTRAVENOUS

## 2014-11-30 MED ORDER — PHENYLEPHRINE HCL 10 MG/ML IJ SOLN
10.0000 mg | INTRAVENOUS | Status: DC | PRN
  Administered 2014-11-30: 30 ug/min via INTRAVENOUS

## 2014-11-30 MED ORDER — ROCURONIUM BROMIDE 100 MG/10ML IV SOLN
INTRAVENOUS | Status: DC | PRN
  Administered 2014-11-30: 50 mg via INTRAVENOUS

## 2014-11-30 MED ORDER — OXYCODONE HCL 5 MG/5ML PO SOLN: 5.0000 mg | Freq: Once | ORAL | Status: DC | PRN

## 2014-11-30 MED ORDER — FENTANYL CITRATE 0.05 MG/ML IJ SOLN: 25.0000 ug | INTRAMUSCULAR | Status: DC | PRN

## 2014-11-30 MED ORDER — HEPARIN SODIUM (PORCINE) 1000 UNIT/ML IJ SOLN
INTRAMUSCULAR | Status: DC | PRN
  Administered 2014-11-30 (×2): 1000 [IU] via INTRAVENOUS

## 2014-11-30 MED ORDER — MIDAZOLAM HCL 2 MG/2ML IJ SOLN: 0.5000 mg | Freq: Once | INTRAMUSCULAR | Status: DC | PRN

## 2014-11-30 MED ORDER — MEPERIDINE HCL 25 MG/ML IJ SOLN: 6.2500 mg | INTRAMUSCULAR | Status: DC | PRN

## 2014-11-30 MED ORDER — VANCOMYCIN HCL 1000 MG IV SOLR
1000.0000 mg | Freq: Once | INTRAVENOUS | Status: DC
  Filled 2014-11-30: qty 1000

## 2014-11-30 MED ORDER — CLOPIDOGREL BISULFATE 75 MG PO TABS
75.0000 mg | ORAL_TABLET | ORAL | Status: DC
  Filled 2014-11-30: qty 1

## 2014-11-30 MED ORDER — OXYCODONE HCL 5 MG PO TABS: 5.0000 mg | ORAL_TABLET | Freq: Once | ORAL | Status: DC | PRN

## 2014-11-30 MED ORDER — NITROGLYCERIN 1 MG/10 ML FOR IR/CATH LAB
INTRA_ARTERIAL | Status: AC
  Filled 2014-11-30: qty 10

## 2014-11-30 MED ORDER — LIDOCAINE HCL (CARDIAC) 20 MG/ML IV SOLN
INTRAVENOUS | Status: DC | PRN
  Administered 2014-11-30: 60 mg via INTRAVENOUS

## 2014-11-30 MED ORDER — SODIUM CHLORIDE 0.9 % IV SOLN: INTRAVENOUS | Status: DC

## 2014-11-30 MED ORDER — VANCOMYCIN HCL IN DEXTROSE 1-5 GM/200ML-% IV SOLN
INTRAVENOUS | Status: AC
  Filled 2014-11-30: qty 200

## 2014-11-30 NOTE — Discharge Instructions (Signed)

## 2014-11-30 NOTE — H&P (Signed)
Chief Complaint: L internal carotid artery aneurysm  Referring Physician(s): Kenslie Abbruzzese K  History of Present Illness: Jasmine Moon is a 73 y.o. 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 Coronary artery disease   . Hypertension   . Dysrhythmia   . Stroke   . Peripheral vascular disease   . Diabetes mellitus without complication   . Depression   . GERD (gastroesophageal reflux disease)   . Arthritis   . Polio   . PONV (postoperative nausea and vomiting)   . Heart murmur   . Anemia   . Left bundle branch block     Past Surgical History  Procedure Laterality Date  . Vascular surgery    . Cardiac catheterization    . Cholecystectomy    . Abdominal aortic aneurysm repair    . Hernia repair    . Renal artery stent      Allergies: Amoxicillin; Codeine; Iron; Penicillins; and Tape  Medications: Prior to Admission medications   Medication Sig Start Date End Date Taking? Authorizing Provider  aspirin 81 MG tablet Take 81 mg by mouth daily.    Historical Provider, MD  atorvastatin (LIPITOR) 80 MG tablet Take 80 mg by mouth daily.    Historical Provider, MD  Calcium Carbonate-Vitamin D (CALCIUM 500 + D PO) Take 1 tablet by mouth 2 (two) times daily.     Historical Provider, MD  citalopram (CELEXA) 10 MG tablet Take 10 mg by mouth daily.    Historical Provider, MD  clopidogrel (PLAVIX) 75 MG tablet Take 75 mg by mouth daily.    Historical Provider, MD  Doxylamine Succinate, Sleep, (SLEEP AID PO) Take 1 tablet by mouth at bedtime as needed (sleep).     Historical  Provider, MD  gabapentin (NEURONTIN) 300 MG capsule Take 300 mg by mouth 2 (two) times daily.    Historical Provider, MD  hydroxypropyl methylcellulose / hypromellose (ISOPTO TEARS / GONIOVISC) 2.5 % ophthalmic solution Place 1 drop into both eyes 3 (three) times daily as needed for dry eyes.    Historical Provider, MD  losartan-hydrochlorothiazide (HYZAAR) 50-12.5 MG per tablet Take 1 tablet by mouth daily.    Historical Provider, MD  metformin (FORTAMET) 1000 MG (OSM) 24 hr tablet Take 1,000 mg by mouth 2 (two) times daily. 10/30/14   Historical Provider, MD  metoprolol (TOPROL-XL) 200 MG 24 hr tablet Take 200 mg by mouth daily.    Historical Provider, MD  Multiple Vitamins-Minerals (MULTIVITAMIN WITH MINERALS) tablet Take 1 tablet by mouth daily.    Historical Provider, MD  Omega-3 Fatty Acids (FISH OIL PO) Take 1 capsule by mouth 2 (two) times daily.    Historical Provider, MD  pantoprazole (PROTONIX) 40 MG tablet Take 40 mg by mouth 2 (two) times daily as needed (reflux).     Historical Provider, MD  potassium chloride (MICRO-K) 10 MEQ CR capsule Take 20 mEq by mouth daily.     Historical Provider, MD    History reviewed. No pertinent family history.  History   Social History  . Marital Status: Married    Spouse Name:  N/A    Number of Children: N/A  . Years of Education: N/A   Social History Main Topics  . Smoking status: Former Games developermoker  . Smokeless tobacco: None  . Alcohol Use: No  . Drug Use: No  . Sexual Activity: None   Other Topics Concern  . None   Social History Narrative    Review of Systems: A 12 point ROS discussed and pertinent positives are indicated in the HPI above.  All other systems are negative.  Review of Systems  Constitutional: Negative for fever, activity change, appetite change and unexpected weight change.  HENT: Negative for ear pain, facial swelling, hearing loss and tinnitus.   Respiratory: Negative for cough, chest tightness and shortness of  breath.   Cardiovascular: Negative for chest pain.  Gastrointestinal: Negative for nausea, vomiting and abdominal pain.  Genitourinary: Negative for difficulty urinating.  Musculoskeletal: Negative for back pain, neck pain and neck stiffness.  Neurological: Negative for dizziness, tremors, seizures, syncope, facial asymmetry, speech difficulty, weakness, light-headedness, numbness and headaches.  Psychiatric/Behavioral: Negative for behavioral problems and confusion.    Vital Signs: BP 139/36 mmHg  Pulse 58  Temp(Src) 98.6 F (37 C) (Oral)  Resp 20  Ht 5\' 5"  (1.651 m)  Wt 73.029 kg (161 lb)  BMI 26.79 kg/m2  SpO2 100%  Physical Exam  Constitutional: She is oriented to person, place, and time. She appears well-nourished.  Eyes:  Rt eye slight droop- since age 27  Neck: Normal range of motion. Neck supple.  Cardiovascular: Normal rate, regular rhythm and normal heart sounds.   No murmur heard. Pulmonary/Chest: Effort normal and breath sounds normal. No respiratory distress. She has no wheezes.  Abdominal: Soft. Bowel sounds are normal. There is no tenderness.  Musculoskeletal: Normal range of motion.  Neurological: She is alert and oriented to person, place, and time.  Skin: Skin is warm and dry.  Psychiatric: She has a normal mood and affect. Her behavior is normal. Judgment and thought content normal.  Nursing note and vitals reviewed.   Imaging: No results found.  Labs:  CBC:  Recent Labs  09/24/14 0736 09/24/14 1050 11/17/14 0857  WBC 5.2 5.3 5.9  HGB 10.8* 10.8* 11.2*  HCT 32.5* 32.7* 34.2*  PLT 121* 140* 159    COAGS:  Recent Labs  09/24/14 0736 09/24/14 1050 11/17/14 0857  INR 0.94 1.02 1.00  APTT 20* 38* 33    BMP:  Recent Labs  09/24/14 0736 09/24/14 1050 11/17/14 0857  NA 143 144 141  K 4.0 3.8 3.9  CL 106 105 102  CO2 24 28 25   GLUCOSE 130* 113* 114*  BUN 16 14 10   CALCIUM 8.9 8.6 9.5  CREATININE 0.78 0.76 0.74  GFRNONAA 82* 83*  84*  GFRAA >90 >90 >90    LIVER FUNCTION TESTS: No results for input(s): BILITOT, AST, ALT, ALKPHOS, PROT, ALBUMIN in the last 8760 hours.  TUMOR MARKERS: No results for input(s): AFPTM, CEA, CA199, CHROMGRNA in the last 8760 hours.  Assessment and Plan:  Left Internal carotid artery aneurysm Scheduled for embolization in IR with Dr Corliss Skainseveshwar Pt still with some blurriness of L eye vision Pt is aware of procedure benefits and risks and agreeable to proceed Consent signed andin chart Understands if intervention is performed she will be admitted to Neuro ICU overnight--and discharged in am  Thank you for this interesting consult.  I greatly enjoyed meeting Jasmine Moon and look forward to participating in their care.  I spent a total of 30 minutes face to face in clinical consultation, greater than 50% of which was counseling/coordinating care for L ICA aneurysm embolization  Signed: TURPIN,PAMELA A 11/30/2014, 7:49 AM

## 2014-11-30 NOTE — Transfer of Care (Signed)
Immediate Anesthesia Transfer of Care Note  Patient: Jasmine HawthorneMary J Moon  Procedure(s) Performed: Procedure(s): EMBOLIZATION (N/A)  Patient Location: PACU  Anesthesia Type:General  Level of Consciousness: awake, alert  and oriented  Airway & Oxygen Therapy: Patient Spontanous Breathing and Patient connected to face mask oxygen  Post-op Assessment: Report given to PACU RN, Post -op Vital signs reviewed and stable, Patient moving all extremities and Patient moving all extremities X 4  Post vital signs: Reviewed and stable  Complications: No apparent anesthesia complications

## 2014-11-30 NOTE — Anesthesia Postprocedure Evaluation (Signed)
  Anesthesia Post-op Note  Patient: Jasmine HawthorneMary J Safley  Procedure(s) Performed: Procedure(s): EMBOLIZATION (N/A)  Patient Location: PACU  Anesthesia Type:General  Level of Consciousness: awake and alert   Airway and Oxygen Therapy: Patient Spontanous Breathing  Post-op Pain: none  Post-op Assessment: Post-op Vital signs reviewed, Patient's Cardiovascular Status Stable and Respiratory Function Stable  Post-op Vital Signs: Reviewed  Filed Vitals:   11/30/14 1210  BP: 146/47  Pulse: 57  Temp:   Resp: 14    Complications: No apparent anesthesia complications

## 2014-11-30 NOTE — Anesthesia Preprocedure Evaluation (Addendum)
Anesthesia Evaluation  Patient identified by MRN, date of birth, ID band Patient awake    Reviewed: Allergy & Precautions, H&P , NPO status , Patient's Chart, lab work & pertinent test results, reviewed documented beta blocker date and time   History of Anesthesia Complications (+) PONV and history of anesthetic complications  Airway Mallampati: I  TM Distance: >3 FB Neck ROM: Full    Dental  (+) Edentulous Upper, Edentulous Lower   Pulmonary former smoker,  breath sounds clear to auscultation        Cardiovascular hypertension, Pt. on medications and Pt. on home beta blockers - angina+ CAD and + Peripheral Vascular Disease Rhythm:Regular Rate:Normal  10/15 stress: no statistically significant ischemia. 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, CVA Sx L face), Residual Symptoms    GI/Hepatic Neg liver ROS, GERD-  Medicated and Controlled,  Endo/Other  diabetes (glu 119), Oral Hypoglycemic Agents  Renal/GU negative Renal ROS     Musculoskeletal   Abdominal   Peds  Hematology negative hematology ROS (+) Blood dyscrasia (plavix), ,   Anesthesia Other Findings   Reproductive/Obstetrics                          Anesthesia Physical Anesthesia Plan  ASA: III  Anesthesia Plan: General   Post-op Pain Management:    Induction: Intravenous  Airway Management Planned: Oral ETT  Additional Equipment: Arterial line  Intra-op Plan:   Post-operative Plan: Possible Post-op intubation/ventilation  Informed Consent: I have reviewed the patients History and Physical, chart, labs and discussed the procedure including the risks, benefits and alternatives for the proposed anesthesia with the patient or authorized representative who has indicated his/her understanding and acceptance.     Plan Discussed with: CRNA and Surgeon  Anesthesia Plan  Comments: (Plan routine monitors, A line, GETA with possible post op ventilation)        Anesthesia Quick Evaluation

## 2014-11-30 NOTE — Procedures (Signed)
S/P Arch aortogram and bilateral common carotid and bilateral subclavian arteriograms. Rt CFa approach. Findings.. Overlapping stents in the innominate and LT Common carotid artery origins.Lt ICA instent stenosis of approx 50 % stenosis. Patent SCA to CCA grafts.bilaterally

## 2014-11-30 NOTE — Sedation Documentation (Signed)
Procedure started, anesthesia present for airway, sedation, anesthesia for the patient.

## 2014-11-30 NOTE — Sedation Documentation (Signed)
Bilateral 215fr sheath pulled and manual pressure held at site, sterile dressing applied to both Left and right groin sites. No hematoma or bleeding noted.

## 2014-11-30 NOTE — Anesthesia Procedure Notes (Signed)
Procedure Name: Intubation Date/Time: 11/30/2014 9:16 AM Performed by: Adonis HousekeeperNGELL, Mehar Kirkwood M Pre-anesthesia Checklist: Patient identified, Emergency Drugs available, Suction available, Patient being monitored and Timeout performed Patient Re-evaluated:Patient Re-evaluated prior to inductionOxygen Delivery Method: Circle system utilized Preoxygenation: Pre-oxygenation with 100% oxygen Intubation Type: IV induction Ventilation: Mask ventilation without difficulty Laryngoscope Size: Miller and 2 Grade View: Grade I Tube type: Oral Tube size: 7.0 mm Number of attempts: 1 Airway Equipment and Method: Stylet Placement Confirmation: ETT inserted through vocal cords under direct vision,  positive ETCO2 and breath sounds checked- equal and bilateral Secured at: 20 cm Tube secured with: Tape Dental Injury: Teeth and Oropharynx as per pre-operative assessment

## 2014-12-01 ENCOUNTER — Encounter (HOSPITAL_COMMUNITY): Payer: Self-pay | Admitting: Interventional Radiology

## 2015-01-04 ENCOUNTER — Other Ambulatory Visit (HOSPITAL_COMMUNITY): Payer: Self-pay | Admitting: Interventional Radiology

## 2015-01-07 ENCOUNTER — Other Ambulatory Visit (HOSPITAL_COMMUNITY): Payer: Self-pay | Admitting: Interventional Radiology

## 2015-01-07 DIAGNOSIS — I671 Cerebral aneurysm, nonruptured: Secondary | ICD-10-CM

## 2015-01-16 ENCOUNTER — Emergency Department (HOSPITAL_COMMUNITY)
Admission: EM | Admit: 2015-01-16 | Discharge: 2015-01-16 | Disposition: A | Discharge status: Home / Self Care | Payer: Medicare Other | Attending: Emergency Medicine | Admitting: Emergency Medicine

## 2015-01-16 ENCOUNTER — Emergency Department (HOSPITAL_COMMUNITY): Payer: Medicare Other

## 2015-01-16 ENCOUNTER — Encounter (HOSPITAL_COMMUNITY): Payer: Self-pay | Admitting: Vascular Surgery

## 2015-01-16 DIAGNOSIS — I739 Peripheral vascular disease, unspecified: Secondary | ICD-10-CM | POA: Diagnosis not present

## 2015-01-16 DIAGNOSIS — M1990 Unspecified osteoarthritis, unspecified site: Secondary | ICD-10-CM | POA: Diagnosis not present

## 2015-01-16 DIAGNOSIS — Z7902 Long term (current) use of antithrombotics/antiplatelets: Secondary | ICD-10-CM | POA: Insufficient documentation

## 2015-01-16 DIAGNOSIS — Z7982 Long term (current) use of aspirin: Secondary | ICD-10-CM | POA: Diagnosis not present

## 2015-01-16 DIAGNOSIS — E119 Type 2 diabetes mellitus without complications: Secondary | ICD-10-CM | POA: Insufficient documentation

## 2015-01-16 DIAGNOSIS — Z8619 Personal history of other infectious and parasitic diseases: Secondary | ICD-10-CM | POA: Diagnosis not present

## 2015-01-16 DIAGNOSIS — Z880 Allergy status to penicillin: Secondary | ICD-10-CM | POA: Diagnosis not present

## 2015-01-16 DIAGNOSIS — Z8673 Personal history of transient ischemic attack (TIA), and cerebral infarction without residual deficits: Secondary | ICD-10-CM | POA: Diagnosis not present

## 2015-01-16 DIAGNOSIS — I499 Cardiac arrhythmia, unspecified: Secondary | ICD-10-CM | POA: Insufficient documentation

## 2015-01-16 DIAGNOSIS — Z862 Personal history of diseases of the blood and blood-forming organs and certain disorders involving the immune mechanism: Secondary | ICD-10-CM | POA: Diagnosis not present

## 2015-01-16 DIAGNOSIS — R519 Headache, unspecified: Secondary | ICD-10-CM

## 2015-01-16 DIAGNOSIS — Z87891 Personal history of nicotine dependence: Secondary | ICD-10-CM | POA: Insufficient documentation

## 2015-01-16 DIAGNOSIS — Z79899 Other long term (current) drug therapy: Secondary | ICD-10-CM | POA: Diagnosis not present

## 2015-01-16 DIAGNOSIS — K219 Gastro-esophageal reflux disease without esophagitis: Secondary | ICD-10-CM | POA: Diagnosis not present

## 2015-01-16 DIAGNOSIS — I2510 Atherosclerotic heart disease of native coronary artery without angina pectoris: Secondary | ICD-10-CM | POA: Diagnosis not present

## 2015-01-16 DIAGNOSIS — I10 Essential (primary) hypertension: Secondary | ICD-10-CM | POA: Insufficient documentation

## 2015-01-16 DIAGNOSIS — R51 Headache: Secondary | ICD-10-CM | POA: Insufficient documentation

## 2015-01-16 DIAGNOSIS — R112 Nausea with vomiting, unspecified: Secondary | ICD-10-CM | POA: Diagnosis not present

## 2015-01-16 DIAGNOSIS — F329 Major depressive disorder, single episode, unspecified: Secondary | ICD-10-CM | POA: Insufficient documentation

## 2015-01-16 DIAGNOSIS — R011 Cardiac murmur, unspecified: Secondary | ICD-10-CM | POA: Diagnosis not present

## 2015-01-16 MED ORDER — SODIUM CHLORIDE 0.9 % IV BOLUS (SEPSIS)
500.0000 mL | Freq: Once | INTRAVENOUS | Status: AC
  Administered 2015-01-16: 500 mL via INTRAVENOUS

## 2015-01-16 MED ORDER — OXYCODONE-ACETAMINOPHEN 5-325 MG PO TABS: 1.0000 | ORAL_TABLET | Freq: Four times a day (QID) | ORAL | Status: AC | PRN

## 2015-01-16 MED ORDER — FENTANYL CITRATE 0.05 MG/ML IJ SOLN
50.0000 ug | Freq: Once | INTRAMUSCULAR | Status: AC
  Administered 2015-01-16: 50 ug via INTRAVENOUS
  Filled 2015-01-16: qty 2

## 2015-01-16 MED ORDER — ONDANSETRON HCL 4 MG/2ML IJ SOLN: 4.0000 mg | Freq: Once | INTRAMUSCULAR | Status: DC

## 2015-01-16 MED ORDER — ONDANSETRON 4 MG PO TBDP: 4.0000 mg | ORAL_TABLET | Freq: Three times a day (TID) | ORAL | Status: AC | PRN

## 2015-01-16 MED ORDER — ONDANSETRON 4 MG PO TBDP
4.0000 mg | ORAL_TABLET | Freq: Once | ORAL | Status: AC
  Administered 2015-01-16: 4 mg via ORAL
  Filled 2015-01-16: qty 1

## 2015-01-16 MED ORDER — METOCLOPRAMIDE HCL 5 MG/ML IJ SOLN
10.0000 mg | Freq: Once | INTRAMUSCULAR | Status: AC
  Administered 2015-01-16: 10 mg via INTRAVENOUS
  Filled 2015-01-16: qty 2

## 2015-01-16 MED ORDER — IOHEXOL 350 MG/ML SOLN
50.0000 mL | Freq: Once | INTRAVENOUS | Status: AC | PRN
  Administered 2015-01-16: 50 mL via INTRAVENOUS

## 2015-01-16 MED ORDER — PROMETHAZINE HCL 25 MG/ML IJ SOLN
12.5000 mg | Freq: Once | INTRAMUSCULAR | Status: AC
  Administered 2015-01-16: 12.5 mg via INTRAVENOUS
  Filled 2015-01-16: qty 1

## 2015-01-16 MED ORDER — ONDANSETRON HCL 4 MG/2ML IJ SOLN
4.0000 mg | Freq: Once | INTRAMUSCULAR | Status: AC
  Administered 2015-01-16: 4 mg via INTRAVENOUS
  Filled 2015-01-16: qty 2

## 2015-01-16 NOTE — Discharge Instructions (Signed)

## 2015-01-16 NOTE — ED Notes (Signed)
Pt reports to the ED for eval of Ha. Pt reports she was sleeping when a HA awoke her from sleep at approx 4 am. She then developed N/V around 12:00. She has hx of brain anyeursm that was suppsoed to be repaired in November/December but she was unable to get the surgery done. She reports it is the worst HA she has ever had. Rate it at a 10/10. Pt pale and actively vomiting. She received 4 mg of Zofran en route with no relief. Pt also hypertensive at 180/74 and she has been complaint with her BP medications. Pt has hx of stents placed. Light and sound make the HA worse. Pt A&Ox4, resp e/u, and skin cool and dry.

## 2015-01-16 NOTE — ED Provider Notes (Signed)
CSN: [id number]     Arrival date & time 01/16/15  1735 History   First MD Initiated Contact with Patient 01/16/15 1737     Chief Complaint  Patient presents with  . Headache     (Consider location/radiation/quality/duration/timing/severity/associated sxs/prior Treatment) Patient is a 74 y.o. female presenting with headaches. The history is provided by the patient and a relative. No language interpreter was used.  Headache Pain location:  Generalized Quality:  Dull Radiates to:  Does not radiate Severity currently:  9/10 Severity at highest:  9/10 Onset quality:  Gradual Duration:  12 hours Timing:  Constant Progression:  Worsening Chronicity:  New Similar to prior headaches: Unable to specify.   Context: not activity   Context comment:  Woke up with this morning Relieved by:  Nothing Worsened by:  Nothing tried Ineffective treatments:  Acetaminophen Associated symptoms: nausea and vomiting   Associated symptoms: no abdominal pain, no blurred vision, no cough, no ear pain, no pain, no facial pain, no fever, no neck pain, no neck stiffness, no numbness and no weakness     Past Medical History  Diagnosis Date  . Coronary artery disease   . Hypertension   . Dysrhythmia   . Stroke   . Peripheral vascular disease   . Diabetes mellitus without complication   . Depression   . GERD (gastroesophageal reflux disease)   . Arthritis   . Polio   . PONV (postoperative nausea and vomiting)   . Heart murmur   . Anemia   . Left bundle branch block    Past Surgical History  Procedure Laterality Date  . Vascular surgery    . Cardiac catheterization    . Cholecystectomy    . Abdominal aortic aneurysm repair    . Hernia repair    . Renal artery stent    . Radiology with anesthesia N/A 11/30/2014    Procedure: EMBOLIZATION;  Surgeon: Oneal Grout, MD;  Location: [facility] OR;  Service: Radiology;  Laterality: N/A;   History reviewed. No pertinent family history. History   Substance Use Topics  . Smoking status: Former Games developer  . Smokeless tobacco: Not on file  . Alcohol Use: No   OB History    No data available     Review of Systems  Constitutional: Negative for fever.  HENT: Negative for ear pain.   Eyes: Negative for blurred vision and pain.  Respiratory: Negative for cough.   Cardiovascular: Negative for chest pain.  Gastrointestinal: Positive for nausea and vomiting. Negative for abdominal pain.  Genitourinary: Negative for dysuria, urgency and frequency.  Musculoskeletal: Negative for neck pain and neck stiffness.  Neurological: Positive for headaches. Negative for numbness.  All other systems reviewed and are negative.     Allergies  Amoxicillin; Codeine; Iron; Penicillins; and Tape  Home Medications   Prior to Admission medications   Medication Sig Start Date End Date Taking? Authorizing Provider  aspirin 81 MG tablet Take 81 mg by mouth daily.    Historical Provider, MD  atorvastatin (LIPITOR) 80 MG tablet Take 80 mg by mouth daily.    Historical Provider, MD  Calcium Carbonate-Vitamin D (CALCIUM 500 + D PO) Take 1 tablet by mouth 2 (two) times daily.     Historical Provider, MD  citalopram (CELEXA) 10 MG tablet Take 10 mg by mouth daily.    Historical Provider, MD  clopidogrel (PLAVIX) 75 MG tablet Take 75 mg by mouth daily.    Historical Provider, MD  Doxylamine Succinate, Sleep, (SLEEP  AID PO) Take 1 tablet by mouth at bedtime as needed (sleep).     Historical Provider, MD  gabapentin (NEURONTIN) 300 MG capsule Take 300 mg by mouth 2 (two) times daily.    Historical Provider, MD  hydroxypropyl methylcellulose / hypromellose (ISOPTO TEARS / GONIOVISC) 2.5 % ophthalmic solution Place 1 drop into both eyes 3 (three) times daily as needed for dry eyes.    Historical Provider, MD  losartan-hydrochlorothiazide (HYZAAR) 50-12.5 MG per tablet Take 1 tablet by mouth daily.    Historical Provider, MD  metformin (FORTAMET) 1000 MG (OSM) 24 hr  tablet Take 1,000 mg by mouth 2 (two) times daily. 10/30/14   Historical Provider, MD  metoprolol (TOPROL-XL) 200 MG 24 hr tablet Take 200 mg by mouth daily.    Historical Provider, MD  Multiple Vitamins-Minerals (MULTIVITAMIN WITH MINERALS) tablet Take 1 tablet by mouth daily.    Historical Provider, MD  Omega-3 Fatty Acids (FISH OIL PO) Take 1 capsule by mouth 2 (two) times daily.    Historical Provider, MD  ondansetron (ZOFRAN ODT) 4 MG disintegrating tablet Take 1 tablet (4 mg total) by mouth every 8 (eight) hours as needed for nausea or vomiting. 01/16/15   Bethann Berkshire, MD  oxyCODONE-acetaminophen (PERCOCET/ROXICET) 5-325 MG per tablet Take 1 tablet by mouth every 6 (six) hours as needed for moderate pain or severe pain. 01/16/15   Bethann Berkshire, MD  pantoprazole (PROTONIX) 40 MG tablet Take 40 mg by mouth 2 (two) times daily as needed (reflux).     Historical Provider, MD  potassium chloride (MICRO-K) 10 MEQ CR capsule Take 20 mEq by mouth daily.     Historical Provider, MD   BP 179/67 mmHg  Pulse 69  Temp(Src) 98 F (36.7 C) (Oral)  Resp 19  SpO2 92% Physical Exam  Constitutional: She is oriented to person, place, and time. She appears well-developed and well-nourished. No distress.  HENT:  Head: Normocephalic and atraumatic.  Eyes: Pupils are equal, round, and reactive to light.  Neck: Normal range of motion.  Cardiovascular: Normal rate, regular rhythm, normal heart sounds and intact distal pulses.   Pulmonary/Chest: Effort normal. No respiratory distress. She has no wheezes. She exhibits no tenderness.  Abdominal: Soft. Bowel sounds are normal. She exhibits no distension. There is no tenderness. There is no rebound and no guarding.  Neurological: She is alert and oriented to person, place, and time. She has normal strength. No cranial nerve deficit or sensory deficit. She exhibits normal muscle tone. Coordination and gait normal.  Strength 5/5 bilateral upper and lower extremities.   Sensation intact x4 extremities.  CN II-XII intact.    Skin: Skin is warm and dry.  Nursing note and vitals reviewed.   ED Course  Procedures (including critical care time) Labs Review Labs Reviewed - No data to display  Imaging Review Ct Angio Head W/cm &/or Wo Cm  01/16/2015   CLINICAL DATA:  Headache  EXAM: CT ANGIOGRAPHY HEAD AND NECK  TECHNIQUE: Multidetector CT imaging of the head and neck was performed using the standard protocol during bolus administration of intravenous contrast. Multiplanar CT image reconstructions and MIPs were obtained to evaluate the vascular anatomy. Carotid stenosis measurements (when applicable) are obtained utilizing NASCET criteria, using the distal internal carotid diameter as the denominator.  CONTRAST:  50mL OMNIPAQUE IOHEXOL 350 MG/ML SOLN  COMPARISON:  09/02/2014  FINDINGS: Due to error, 2 mm slices are the thinnest available currently.  CT HEAD  Skull and Sinuses:Negative for fracture or  destructive process. The mastoids, middle ears, and imaged paranasal sinuses are clear.  Orbits: No acute abnormality.  Brain: No evidence of acute infarction, hemorrhage, hydrocephalus, or mass lesion/mass effect.  CTA NECK  Aortic arch: Extensive atherosclerotic calcification. No evidence of aneurysm or dissection. There has been stenting of the innominate and left common carotid arteries with extrusion of the graft into the arch which is stable from prior. There is compression of the proximal left common carotid artery stent which is stable from prior. Kinking and end stent stenosis of the innominate stent is also stable, recently characterized by catheter angiography. 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 There is a right subclavian to carotid bypass which is also patent, with moderate long segment stenosis of the subclavian proximal to the anastomosis.  Right carotid system: Innominate end  stent stenosis is unchanged, characterized by recent catheter angiography. There is atherosclerotic calcification at the bifurcation without significant stenosis. No evidence of dissection or other acute change.  Left carotid system: Stenting of the proximal common carotid artery which is patent. There is a left subclavian to carotid bypass which is widely patent, although there is inflow disease of the subclavian artery with high-grade stenosis. Atherosclerotic calcification at the bifurcation without significant ICA stenosis. No dissection or acute vascular change.  Vertebral arteries: Mild right dominance. As before there is high-grade stenosis at the left vertebral artery origin from calcified and noncalcified plaque. Multi focal calcified plaque in the left V2 segment without flow limiting stenosis. Atherosclerotic calcification throughout the right vertebral artery without evidence of flow limiting stenosis.  Other neck: Prominent but stable mediastinal nodes in the peritracheal station.  Bronchial wall thickening in the bilateral upper lungs.  CTA HEAD  Anterior circulation: Moderate atherosclerotic calcification the bilateral cavernous sinuses. No high-grade stenosis. A 3 mm inferior and posteriorly directed aneurysm from the left paraclinoid segment is stable a 3 mm. There is a dome nipple which is also stable given differences in technique. No notable intracranial stenosis. No evidence of vascular malformation.  Posterior circulation: The non dominant left vertebral artery ends in the PICA. No stenosis or occlusion. The right vertebral artery flows into a normal basilar artery. Hypoplastic right P1 in the setting of large posterior communicating artery. The vascular occlusion.  Venous sinuses: Patent  Anatomic variants: Fetal type right PCA.  Delayed phase: Negative.  IMPRESSION: 1. No acute intracranial or arterial findings. 2. 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 3.  Patent bilateral subclavian to carotid graft. 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 4. 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 The left vertebral artery ends in the left PICA.   Electronically Signed   By: Tiburcio Pea M.D.   On: 01/16/2015 21:21   Ct Angio Neck W/cm &/or Wo/cm  01/16/2015   CLINICAL DATA:  Headache  EXAM: CT ANGIOGRAPHY HEAD AND NECK  TECHNIQUE: Multidetector CT imaging of the head and neck was performed using the standard protocol during bolus administration of intravenous contrast. Multiplanar CT image reconstructions and MIPs were obtained to evaluate the vascular anatomy. Carotid stenosis measurements (when applicable) are obtained utilizing NASCET criteria, using the distal internal carotid diameter as the denominator.  CONTRAST:  50mL OMNIPAQUE IOHEXOL 350 MG/ML SOLN  COMPARISON:  09/02/2014  FINDINGS: Due to error, 2 mm slices are the thinnest available currently.  CT HEAD  Skull  and Sinuses:Negative for fracture or destructive process. The mastoids, middle ears, and imaged paranasal sinuses are clear.  Orbits: No acute abnormality.  Brain: No evidence of acute infarction, hemorrhage, hydrocephalus, or mass lesion/mass effect.  CTA NECK  Aortic arch: Extensive atherosclerotic calcification. No evidence of aneurysm or dissection. There has been stenting of the innominate and left common carotid arteries with extrusion of the graft into the arch which is stable from prior. There is compression of the proximal left common carotid artery stent which is stable from prior. Kinking and end stent stenosis of the innominate stent is also stable, recently characterized by catheter angiography. 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 There is a right subclavian to carotid bypass which is also patent, with moderate long segment stenosis of  the subclavian proximal to the anastomosis.  Right carotid system: Innominate end stent stenosis is unchanged, characterized by recent catheter angiography. There is atherosclerotic calcification at the bifurcation without significant stenosis. No evidence of dissection or other acute change.  Left carotid system: Stenting of the proximal common carotid artery which is patent. There is a left subclavian to carotid bypass which is widely patent, although there is inflow disease of the subclavian artery with high-grade stenosis. Atherosclerotic calcification at the bifurcation without significant ICA stenosis. No dissection or acute vascular change.  Vertebral arteries: Mild right dominance. As before there is high-grade stenosis at the left vertebral artery origin from calcified and noncalcified plaque. Multi focal calcified plaque in the left V2 segment without flow limiting stenosis. Atherosclerotic calcification throughout the right vertebral artery without evidence of flow limiting stenosis.  Other neck: Prominent but stable mediastinal nodes in the peritracheal station.  Bronchial wall thickening in the bilateral upper lungs.  CTA HEAD  Anterior circulation: Moderate atherosclerotic calcification the bilateral cavernous sinuses. No high-grade stenosis. A 3 mm inferior and posteriorly directed aneurysm from the left paraclinoid segment is stable a 3 mm. There is a dome nipple which is also stable given differences in technique. No notable intracranial stenosis. No evidence of vascular malformation.  Posterior circulation: The non dominant left vertebral artery ends in the PICA. No stenosis or occlusion. The right vertebral artery flows into a normal basilar artery. Hypoplastic right P1 in the setting of large posterior communicating artery. The vascular occlusion.  Venous sinuses: Patent  Anatomic variants: Fetal type right PCA.  Delayed phase: Negative.  IMPRESSION: 1. No acute intracranial or arterial findings.  2. 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 3. Patent bilateral subclavian to carotid graft. 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 4. 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 The left vertebral artery ends in the left PICA.   Electronically Signed   By: Tiburcio PeaJonathan  Watts M.D.   On: 01/16/2015 21:21     EKG Interpretation None      MDM   Final diagnoses:  Headache  Non-intractable vomiting with nausea, vomiting of unspecified type   Patient is a 74 year old Caucasian female with pertinent past medical history of an intracranial aneurysm scheduled for repair on February 1 who comes to the emergency department today with severe headache since waking this morning. With slow onset of the headache I feel that it is unlikely that this is a subarachnoid hemorrhage. However with the patient's history of aneurysm I feel she requires a repeat CTA to evaluate this further. With no  neurologic deficits doubt a CVA. Patient was treated with Reglan for headache and nausea with moderate improvement in her nausea but minimal improvement in her headache. She was treated with a dose of fentanyl with significant improvement of the headache.  Patient was reportedly hypertensive in the field with blood pressure in the 210s however this decreased to 180s without treatment she does not have any chest pain, weakness, numbness, blurred vision, or shortness of breath as a result doubt a hypertensive emergency. CT of the head and neck demonstrated no acute changes. There is no evidence of subarachnoid hemorrhage. The aneurysm was unchanged in appearance. With patient presenting within 12 hours of the onset of her symptoms, slow onset of the headache, low suspicion of subarachnoid hemorrhage at baseline, I do not feel that we need to pursue subarachnoid hemorrhage further and that the CT should be sufficient to rule this out.  Patient was reevaluated after his second treatment with fentanyl and Phenergan with complete resolution of her headache and her nausea. Patient has no abdominal pain, no abdominal distension, and a benign abdominal exam.  Doubt appendicitis, acute cholecystitis, pancreatitis, or mesenteric ischemia.  She is stable for discharge back home. She was provided with a prescription for Zofran and for Percocet. She was instructed to maintain her follow-up with her neurosurgeon and to return to the emergency department with worsening headache, weakness, numbness, or any other concerns. The patient and her daughter expressed understanding. She was discharged in good condition. Imaging was reviewed by myself and considered in medical decision making. Imaging was interpreted by radiology. Care was discussed with my attending Dr. Romeo Apple.       Bethann Berkshire, MD 01/16/15 1610  Purvis Sheffield, MD 01/16/15 2236

## 2015-01-16 NOTE — ED Notes (Signed)
Pt began vomiting bile. ED resident aware and ordered ODT Zofran.

## 2015-01-19 ENCOUNTER — Other Ambulatory Visit: Payer: Self-pay | Admitting: Radiology

## 2015-01-20 ENCOUNTER — Encounter (HOSPITAL_COMMUNITY): Payer: Self-pay

## 2015-01-20 ENCOUNTER — Encounter (HOSPITAL_COMMUNITY)
Admission: RE | Admit: 2015-01-20 | Discharge: 2015-01-20 | Disposition: A | Discharge status: Home / Self Care | Payer: Medicare Other | Source: Ambulatory Visit | Attending: Interventional Radiology | Admitting: Interventional Radiology

## 2015-01-20 DIAGNOSIS — Z87891 Personal history of nicotine dependence: Secondary | ICD-10-CM | POA: Diagnosis not present

## 2015-01-20 DIAGNOSIS — E119 Type 2 diabetes mellitus without complications: Secondary | ICD-10-CM | POA: Diagnosis not present

## 2015-01-20 DIAGNOSIS — I2510 Atherosclerotic heart disease of native coronary artery without angina pectoris: Secondary | ICD-10-CM | POA: Diagnosis not present

## 2015-01-20 DIAGNOSIS — Z01812 Encounter for preprocedural laboratory examination: Secondary | ICD-10-CM | POA: Insufficient documentation

## 2015-01-20 DIAGNOSIS — I10 Essential (primary) hypertension: Secondary | ICD-10-CM | POA: Diagnosis not present

## 2015-01-20 HISTORY — DX: Presence of spectacles and contact lenses: Z97.3

## 2015-01-20 HISTORY — DX: Aneurysm of carotid artery: I72.0

## 2015-01-20 HISTORY — DX: Headache: R51

## 2015-01-20 HISTORY — DX: Occlusion and stenosis of left carotid artery: I65.22

## 2015-01-20 HISTORY — DX: Headache, unspecified: R51.9

## 2015-01-20 LAB — COMPREHENSIVE METABOLIC PANEL
ALT: 24 U/L (ref 0–35)
AST: 20 U/L (ref 0–37)
Albumin: 3.7 g/dL (ref 3.5–5.2)
Alkaline Phosphatase: 65 U/L (ref 39–117)
Anion gap: 6 (ref 5–15)
BUN: 20 mg/dL (ref 6–23)
CO2: 30 mmol/L (ref 19–32)
Calcium: 9.7 mg/dL (ref 8.4–10.5)
Chloride: 104 mmol/L (ref 96–112)
Creatinine, Ser: 0.95 mg/dL (ref 0.50–1.10)
GFR calc Af Amer: 68 mL/min — ABNORMAL LOW (ref >90)
GFR calc non Af Amer: 59 mL/min — ABNORMAL LOW (ref >90)
Glucose, Bld: 136 mg/dL — ABNORMAL HIGH (ref 70–99)
Potassium: 4.6 mmol/L (ref 3.5–5.1)
Sodium: 140 mmol/L (ref 135–145)
Total Bilirubin: 0.9 mg/dL (ref 0.3–1.2)
Total Protein: 6.2 g/dL (ref 6.0–8.3)

## 2015-01-20 LAB — CBC WITH DIFFERENTIAL/PLATELET
Basophils Absolute: 0 10*3/uL (ref 0.0–0.1)
Basophils Relative: 0 % (ref 0–1)
Eosinophils Absolute: 0.1 10*3/uL (ref 0.0–0.7)
Eosinophils Relative: 1 % (ref 0–5)
HCT: 38.9 % (ref 36.0–46.0)
Hemoglobin: 13.4 g/dL (ref 12.0–15.0)
Lymphocytes Relative: 16 % (ref 12–46)
Lymphs Abs: 2.1 10*3/uL (ref 0.7–4.0)
MCH: 29.4 pg (ref 26.0–34.0)
MCHC: 34.4 g/dL (ref 30.0–36.0)
MCV: 85.3 fL (ref 78.0–100.0)
Monocytes Absolute: 0.7 10*3/uL (ref 0.1–1.0)
Monocytes Relative: 6 % (ref 3–12)
Neutro Abs: 10.1 10*3/uL — ABNORMAL HIGH (ref 1.7–7.7)
Neutrophils Relative %: 77 % (ref 43–77)
Platelets: 206 10*3/uL (ref 150–400)
RBC: 4.56 MIL/uL (ref 3.87–5.11)
RDW: 15 % (ref 11.5–15.5)
WBC: 13 10*3/uL — ABNORMAL HIGH (ref 4.0–10.5)

## 2015-01-20 LAB — PROTIME-INR
INR: 1.01 (ref 0.00–1.49)
Prothrombin Time: 13.4 seconds (ref 11.6–15.2)

## 2015-01-20 LAB — PLATELET INHIBITION P2Y12: Platelet Function  P2Y12: 3 [PRU] — ABNORMAL LOW (ref 194–418)

## 2015-01-20 LAB — GLUCOSE, CAPILLARY: Glucose-Capillary: 161 mg/dL — ABNORMAL HIGH (ref 70–99)

## 2015-01-20 LAB — APTT: aPTT: 25 seconds (ref 24–37)

## 2015-01-20 NOTE — Progress Notes (Signed)
Pt denies SOB, chest pain, and being under the care of a cardiologist. Pt scheduled to f/u with PCP Dr. Derrell LollingJobe,  tomorrow regarding recent treatment for chest congestion; pt advised to make surgeon aware. According to pt, she will have completed her Prednisone and Doxycycline at the end of this month. Spoke with Revonda StandardAllison, PA ( anesthesia) regardng consult; no new orders given.

## 2015-01-20 NOTE — Pre-Procedure Instructions (Signed)
Elouise J Dearmond  01/20/2015   Your procedure is scheduled on: Monday, January 25, 2015  Report to [facility]mitting at 6:30 AM.  Call this number if you have problems the morning of surgery: [phone]   Remember:   Do not eat food or drink liquids after midnight Sunday, January 24, 2015   Take these medicines the morning of surgery with A SIP OF WATER: aspirin, citalopram (CELEXA),  clopidogrel (PLAVIX), metoprolol (TOPROL-XL), pantoprazole (PROTONIX), if needed:oxyCODONE for pain, ondansetron (ZOFRAN ODT) for nausea or vomiting, ISOPTO TEARS for dry eyes, albuterol (PROVENTIL HFA;VENTOLIN HFA) inhaler for wheezing or shortness of breath ( Bring inhaler in with you on day of procedure).  DO NOT TAKE ANY DIABETIC MEDICATIONS THE MORNING OF PROCEDURE ( metformin (FORTAMET)   Stop taking vitamins and herbal medications (Omega-3 Fatty Acids (FISH OIL).   Do not take any NSAIDs ie: Ibuprofen, Motrin,  Advil, Naproxen, Aleve, BC or Goody powder; stop now   Do not wear jewelry, make-up or nail polish.  Do not wear lotions, powders, or perfumes. You may not wear deodorant.  Do not shave 48 hours prior to surgery.   Do not bring valuables to the hospital.  [facility] is not responsible for any belongings or valuables.               Contacts, dentures or bridgework may not be worn into surgery.  Leave suitcase in the car. After surgery it may be brought to your room.  For patients admitted to the hospital, discharge time is determined by your treatment team.               Patients discharged the day of surgery will not be allowed to drive home.  Name and phone number of your driver:  Special Instructions:  Special Instructions:Special Instructions: [facility] - Preparing for Surgery  Before surgery, you can play an important role.  Because skin is not sterile, your skin needs to be as free of germs as possible.  You can reduce the number of germs on you skin by washing with CHG  (chlorahexidine gluconate) soap before surgery.  CHG is an antiseptic cleaner which kills germs and bonds with the skin to continue killing germs even after washing.  Please DO NOT use if you have an allergy to CHG or antibacterial soaps.  If your skin becomes reddened/irritated stop using the CHG and inform your nurse when you arrive at Short Stay.  Do not shave (including legs and underarms) for at least 48 hours prior to the first CHG shower.  You may shave your face.  Please follow these instructions carefully:   1.  Shower with CHG Soap the night before surgery and the morning of Surgery.  2.  If you choose to wash your hair, wash your hair first as usual with your normal shampoo.  3.  After you shampoo, rinse your hair and body thoroughly to remove the Shampoo.  4.  Use CHG as you would any other liquid soap.  You can apply chg directly  to the skin and wash gently with scrungie or a clean washcloth.  5.  Apply the CHG Soap to your body ONLY FROM THE NECK DOWN.  Do not use on open wounds or open sores.  Avoid contact with your eyes, ears, mouth and genitals (private parts).  Wash genitals (private parts) with your normal soap.  6.  Wash thoroughly, paying special attention to the area where your surgery will be   performed.  7.  Thoroughly rinse your body with warm water from the neck down.  8.  DO NOT shower/wash with your normal soap after using and rinsing off the CHG Soap.  9.  Pat yourself dry with a clean towel.            10.  Wear clean pajamas.            11.  Place clean sheets on your bed the night of your first shower and do not sleep with pets.  Day of Surgery  Do not apply any lotions/deodorants the morning of surgery.  Please wear clean clothes to the hospital/surgery center.   Please read over the following fact sheets that you were given: Pain Booklet and Surgical Site Infection Prevention  

## 2015-01-20 NOTE — Progress Notes (Signed)
Anesthesia Chart Review: Patient is a 74 year old female scheduled for cerebral arteriogram with aneurysm embolization on 01/25/15 by Dr. Corliss Skains. 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 Procedure was scheduled for 11/30/14 but only a cerebral angiogram was performed due to the inability to access through the previously overlapping stented segment of the left ICA origion. By notes, he will attempt the left subclavian route for access into the intracranial compartment for treatment of her ICA aneurysm.   Other history includes former smoker, CAD, dysrhythmia (not specified; has left BBB), murmur (mild MR/TR 09/2014 echo), HTN, PAD s/p AFBG (AAA repair), renal artery stent, CVA (left eye amaurosis fugax), DM2, GERD, polio, iron deficiency anemia, depression, arthritis, cholecystectomy, post-operative N/V. Previous IR notes also indicate a history of left carotid stent and subclavian bypass with innominate stent. Most of her vascular procedures have been done at [facility]. She was recently evaluated in the ED for N/V and later started on antibiotics and steroid for chest congestion.  She will have completed her course prior to her procedure date.  She is scheduled to see her PCP Dr. Synetta Fail for follow-up tomorrow and was instructed to contact Dr. Fatima Sanger office if Dr. Derrell Lolling felt the procedure should be delayed.  Patient denied fevers.  I told her PAT RN to instruct patient that she should be well, afebrile, no wheezing, etc for her surgery.   Cardiologist is Dr. Jacinto Halim (sent by IR for a preoperative evaluation) who cleared her for this procedure 09/2014 following a low risk stress test. Jasmine Moon are scanned under the Media tab.)  Meds include albuterol, calcium with D, doxylamine succinate, Fish oil, ASA , Lipitor, Celexa, Plavix, Neurontin, Isopto tears, Hyzaar, Fortamet, Toprol XL, Protonix, KCl. Doxycycline and prednisone should be completed prior to 01/25/15.  EKG on  10/16/14 [facility] CV): SB, LAE, left BBB.   Nuclear stress test on 10/21/14: Resting ECG demonstrates normal sinus rhythm, left bundle branch block. The perfusion imaging study demonstrated minimal septal thickening which can be seen in left bundle branch block, but otherwise no statistically significant ischemia. LVEF is 69%. This is a low risk study. Based on this study patient can be taken up for the upcoming neuro endovascular procedure with acceptable cardiovascular risk (Dr. Jacinto Halim).  Echo on 10/22/14 [facility] CV): Left ventricle cavity is normal in size. Moderate concentric hypertrophy. Normal global wall motion. Visual EF 65-70%. Mildly dilated left atrial cavity. Mild mitral regurgitation. Mild tricuspid regurgitation. No evidence of pulmonary hypertension.  There was no prior cardiac cath at [facility]   Cerebral angiogram on 09/24/14: Findings: Approx 3.63mm x 3.2 mm Lt ICA intracrania; pCom aneurysm. Lt CCA artery occluded. CCA graft distal to origin of LT VA. Severe narrowing of Lt VA origin. Occluded LT VBJ. 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 Severe stenosis Lt SCA prox to graft.  Preoperative labs noted. WBC 13.0 (on doxycycline and prednisone), A1C on 11/04/14 was 6.5. P2Y12 is 3.  She is on Plavix.  I faxed PAT labs to Dr. Fatima Sanger office with confirmation. I also eft a voice message with Victorino Dike at his office asking him to review labs and that patient should be calling for follow-up regarding her recent treatment for URI.    Patient was previously cleared by cardiology.  She has follow-up with her PCP tomorrow regarding "chest congestion" and was told to follow-up with Dr. Fatima Sanger office regarding Dr. Gerrie Nordmann' recommendations.  If her respiratory symptoms have resolved then she should  be able to proceed from an anesthesia standpoint.    Jasmine Ochsllison Zailyn Thoennes, PA-C [facility]/Anesthesiology Phone [phone] 01/20/2015 4:33 PM

## 2015-01-20 NOTE — Pre-Procedure Instructions (Signed)
Jasmine Moon  01/20/2015   Your procedure is scheduled on: Monday, January 25, 2015  Report to [facility] Admitting at 6:30 AM.  Call this number if you have problems the morning of surgery: [phone]   Remember:   Do not eat food or drink liquids after midnight Sunday, January 24, 2015   Take these medicines the morning of surgery with A SIP OF WATER: aspirin, citalopram (CELEXA),  clopidogrel (PLAVIX), metoprolol (TOPROL-XL), pantoprazole (PROTONIX), if needed:oxyCODONE for pain, ondansetron (ZOFRAN ODT) for nausea or vomiting, ISOPTO TEARS for dry eyes, albuterol (PROVENTIL HFA;VENTOLIN HFA) inhaler for wheezing or shortness of breath ( Bring inhaler in with you on day of procedure).  DO NOT TAKE ANY DIABETIC MEDICATIONS THE MORNING OF PROCEDURE ( metformin (FORTAMET)   Stop taking vitamins and herbal medications (Omega-3 Fatty Acids (FISH OIL).   Do not take any NSAIDs ie: Ibuprofen, Motrin,  Advil, Naproxen, Aleve, BC or Goody powder; stop now   Do not wear jewelry, make-up or nail polish.  Do not wear lotions, powders, or perfumes. You may not wear deodorant.  Do not shave 48 hours prior to surgery.   Do not bring valuables to the hospital.  [facility] is not responsible for any belongings or valuables.               Contacts, dentures or bridgework may not be worn into surgery.  Leave suitcase in the car. After surgery it may be brought to your room.  For patients admitted to the hospital, discharge time is determined by your treatment team.               Patients discharged the day of surgery will not be allowed to drive home.  Name and phone number of your driver:  Special Instructions:  Special Instructions:Special Instructions: [facility] - Preparing for Surgery  Before surgery, you can play an important role.  Because skin is not sterile, your skin needs to be as free of germs as possible.  You can reduce the number of germs on you skin by washing with CHG  (chlorahexidine gluconate) soap before surgery.  CHG is an antiseptic cleaner which kills germs and bonds with the skin to continue killing germs even after washing.  Please DO NOT use if you have an allergy to CHG or antibacterial soaps.  If your skin becomes reddened/irritated stop using the CHG and inform your nurse when you arrive at Short Stay.  Do not shave (including legs and underarms) for at least 48 hours prior to the first CHG shower.  You may shave your face.  Please follow these instructions carefully:   1.  Shower with CHG Soap the night before surgery and the morning of Surgery.  2.  If you choose to wash your hair, wash your hair first as usual with your normal shampoo.  3.  After you shampoo, rinse your hair and body thoroughly to remove the Shampoo.  4.  Use CHG as you would any other liquid soap.  You can apply chg directly  to the skin and wash gently with scrungie or a clean washcloth.  5.  Apply the CHG Soap to your body ONLY FROM THE NECK DOWN.  Do not use on open wounds or open sores.  Avoid contact with your eyes, ears, mouth and genitals (private parts).  Wash genitals (private parts) with your normal soap.  6.  Wash thoroughly, paying special attention to the area where your surgery will be  performed.  7.  Thoroughly rinse your body with warm water from the neck down.  8.  DO NOT shower/wash with your normal soap after using and rinsing off the CHG Soap.  9.  Pat yourself dry with a clean towel.            10.  Wear clean pajamas.            11.  Place clean sheets on your bed the night of your first shower and do not sleep with pets.  Day of Surgery  Do not apply any lotions/deodorants the morning of surgery.  Please wear clean clothes to the hospital/surgery center.   Please read over the following fact sheets that you were given: Pain Booklet and Surgical Site Infection Prevention

## 2015-01-21 ENCOUNTER — Other Ambulatory Visit: Payer: Self-pay | Admitting: Radiology

## 2015-01-24 NOTE — Anesthesia Preprocedure Evaluation (Addendum)
Anesthesia Evaluation  Patient identified by MRN, date of birth, ID band Patient awake    Reviewed: Allergy & Precautions, NPO status , Patient's Chart, lab work & pertinent test results  History of Anesthesia Complications (+) PONV  Airway Mallampati: II   Neck ROM: Full    Dental  (+) Edentulous Upper, Dental Advisory Given   Pulmonary former smoker,  breath sounds clear to auscultation        Cardiovascular hypertension, Pt. on medications + CAD and + Peripheral Vascular Disease Rhythm:Regular  Has had resent ECHO and Stress test both showed EF 60% low risk stress   Neuro/Psych Depression L ICA aneurysm, will be going subclavian approach to try to gain access to crtebral circulation 2nd to obsgtructions CVA    GI/Hepatic GERD-  Medicated,  Endo/Other  diabetes, Type 2  Renal/GU GFR 62     Musculoskeletal   Abdominal (+)  Abdomen: soft.    Peds  Hematology 13/38 H/H   Anesthesia Other Findings   Reproductive/Obstetrics                            Anesthesia Physical Anesthesia Plan  ASA: III  Anesthesia Plan: General   Post-op Pain Management:    Induction: Intravenous  Airway Management Planned: Oral ETT  Additional Equipment: Arterial line  Intra-op Plan:   Post-operative Plan: Extubation in OR  Informed Consent: I have reviewed the patients History and Physical, chart, labs and discussed the procedure including the risks, benefits and alternatives for the proposed anesthesia with the patient or authorized representative who has indicated his/her understanding and acceptance.     Plan Discussed with:   Anesthesia Plan Comments:         Anesthesia Quick Evaluation

## 2015-01-25 ENCOUNTER — Ambulatory Visit (HOSPITAL_COMMUNITY)
Admission: RE | Admit: 2015-01-25 | Discharge: 2015-01-26 | Disposition: A | Discharge status: Home / Self Care | Payer: Medicare Other | Source: Ambulatory Visit | Attending: Interventional Radiology | Admitting: Interventional Radiology

## 2015-01-25 ENCOUNTER — Ambulatory Visit (HOSPITAL_COMMUNITY)
Admission: RE | Admit: 2015-01-25 | Discharge: 2015-01-25 | Disposition: A | Discharge status: Home / Self Care | Payer: Medicaid Other | Source: Ambulatory Visit | Attending: Interventional Radiology | Admitting: Interventional Radiology

## 2015-01-25 ENCOUNTER — Encounter (HOSPITAL_COMMUNITY): Payer: Self-pay

## 2015-01-25 ENCOUNTER — Ambulatory Visit (HOSPITAL_COMMUNITY): Payer: Medicare Other | Admitting: Anesthesiology

## 2015-01-25 ENCOUNTER — Other Ambulatory Visit: Payer: Self-pay

## 2015-01-25 ENCOUNTER — Ambulatory Visit (HOSPITAL_COMMUNITY): Payer: Medicare Other | Admitting: Vascular Surgery

## 2015-01-25 ENCOUNTER — Encounter (HOSPITAL_COMMUNITY)
Admission: RE | Disposition: A | Discharge status: Home / Self Care | Payer: Self-pay | Source: Ambulatory Visit | Attending: Interventional Radiology

## 2015-01-25 ENCOUNTER — Encounter (HOSPITAL_COMMUNITY): Payer: Self-pay | Admitting: *Deleted

## 2015-01-25 DIAGNOSIS — Z87891 Personal history of nicotine dependence: Secondary | ICD-10-CM | POA: Insufficient documentation

## 2015-01-25 DIAGNOSIS — I671 Cerebral aneurysm, nonruptured: Secondary | ICD-10-CM | POA: Diagnosis present

## 2015-01-25 DIAGNOSIS — Z7982 Long term (current) use of aspirin: Secondary | ICD-10-CM | POA: Diagnosis not present

## 2015-01-25 DIAGNOSIS — I2510 Atherosclerotic heart disease of native coronary artery without angina pectoris: Secondary | ICD-10-CM | POA: Insufficient documentation

## 2015-01-25 DIAGNOSIS — Z79899 Other long term (current) drug therapy: Secondary | ICD-10-CM | POA: Insufficient documentation

## 2015-01-25 DIAGNOSIS — A809 Acute poliomyelitis, unspecified: Secondary | ICD-10-CM

## 2015-01-25 DIAGNOSIS — Z885 Allergy status to narcotic agent status: Secondary | ICD-10-CM | POA: Insufficient documentation

## 2015-01-25 DIAGNOSIS — H538 Other visual disturbances: Secondary | ICD-10-CM | POA: Diagnosis not present

## 2015-01-25 DIAGNOSIS — Z7902 Long term (current) use of antithrombotics/antiplatelets: Secondary | ICD-10-CM | POA: Diagnosis not present

## 2015-01-25 DIAGNOSIS — I739 Peripheral vascular disease, unspecified: Secondary | ICD-10-CM | POA: Insufficient documentation

## 2015-01-25 DIAGNOSIS — E119 Type 2 diabetes mellitus without complications: Secondary | ICD-10-CM | POA: Insufficient documentation

## 2015-01-25 DIAGNOSIS — I10 Essential (primary) hypertension: Secondary | ICD-10-CM | POA: Insufficient documentation

## 2015-01-25 DIAGNOSIS — F329 Major depressive disorder, single episode, unspecified: Secondary | ICD-10-CM | POA: Insufficient documentation

## 2015-01-25 DIAGNOSIS — K219 Gastro-esophageal reflux disease without esophagitis: Secondary | ICD-10-CM | POA: Diagnosis not present

## 2015-01-25 DIAGNOSIS — Z8673 Personal history of transient ischemic attack (TIA), and cerebral infarction without residual deficits: Secondary | ICD-10-CM | POA: Diagnosis not present

## 2015-01-25 DIAGNOSIS — Z880 Allergy status to penicillin: Secondary | ICD-10-CM | POA: Diagnosis not present

## 2015-01-25 DIAGNOSIS — I672 Cerebral atherosclerosis: Secondary | ICD-10-CM | POA: Diagnosis not present

## 2015-01-25 HISTORY — PX: RADIOLOGY WITH ANESTHESIA: SHX6223

## 2015-01-25 LAB — CBC WITH DIFFERENTIAL/PLATELET
Basophils Absolute: 0 10*3/uL (ref 0.0–0.1)
Basophils Absolute: 0 10*3/uL (ref 0.0–0.1)
Basophils Relative: 0 % (ref 0–1)
Basophils Relative: 0 % (ref 0–1)
Eosinophils Absolute: 0.2 10*3/uL (ref 0.0–0.7)
Eosinophils Absolute: 0 10*3/uL (ref 0.0–0.7)
Eosinophils Relative: 2 % (ref 0–5)
Eosinophils Relative: 1 % (ref 0–5)
HCT: 36.5 % (ref 36.0–46.0)
HCT: 33.5 % — ABNORMAL LOW (ref 36.0–46.0)
Hemoglobin: 12.5 g/dL (ref 12.0–15.0)
Hemoglobin: 11.5 g/dL — ABNORMAL LOW (ref 12.0–15.0)
Lymphocytes Relative: 21 % (ref 12–46)
Lymphocytes Relative: 11 % — ABNORMAL LOW (ref 12–46)
Lymphs Abs: 2.1 10*3/uL (ref 0.7–4.0)
Lymphs Abs: 0.6 10*3/uL — ABNORMAL LOW (ref 0.7–4.0)
MCH: 29.2 pg (ref 26.0–34.0)
MCH: 29.8 pg (ref 26.0–34.0)
MCHC: 34.2 g/dL (ref 30.0–36.0)
MCHC: 34.3 g/dL (ref 30.0–36.0)
MCV: 85.3 fL (ref 78.0–100.0)
MCV: 86.8 fL (ref 78.0–100.0)
Monocytes Absolute: 0.8 10*3/uL (ref 0.1–1.0)
Monocytes Absolute: 0.1 10*3/uL (ref 0.1–1.0)
Monocytes Relative: 8 % (ref 3–12)
Monocytes Relative: 2 % — ABNORMAL LOW (ref 3–12)
Neutro Abs: 6.9 10*3/uL (ref 1.7–7.7)
Neutro Abs: 4.8 10*3/uL (ref 1.7–7.7)
Neutrophils Relative %: 69 % (ref 43–77)
Neutrophils Relative %: 86 % — ABNORMAL HIGH (ref 43–77)
Platelets: 150 10*3/uL (ref 150–400)
Platelets: 123 10*3/uL — ABNORMAL LOW (ref 150–400)
RBC: 4.28 MIL/uL (ref 3.87–5.11)
RBC: 3.86 MIL/uL — ABNORMAL LOW (ref 3.87–5.11)
RDW: 15 % (ref 11.5–15.5)
RDW: 15.3 % (ref 11.5–15.5)
WBC: 10.1 10*3/uL (ref 4.0–10.5)
WBC: 5.6 10*3/uL (ref 4.0–10.5)

## 2015-01-25 LAB — HEPARIN LEVEL (UNFRACTIONATED)
Heparin Unfractionated: 0.51 IU/mL (ref 0.30–0.70)
Heparin Unfractionated: 0.5 IU/mL (ref 0.30–0.70)

## 2015-01-25 LAB — POCT ACTIVATED CLOTTING TIME
Activated Clotting Time: 183 seconds
Activated Clotting Time: 202 seconds
Activated Clotting Time: 183 seconds

## 2015-01-25 LAB — PLATELET INHIBITION P2Y12: Platelet Function  P2Y12: 41 [PRU] — ABNORMAL LOW (ref 194–418)

## 2015-01-25 LAB — GLUCOSE, CAPILLARY
Glucose-Capillary: 102 mg/dL — ABNORMAL HIGH (ref 70–99)
Glucose-Capillary: 89 mg/dL (ref 70–99)
Glucose-Capillary: 173 mg/dL — ABNORMAL HIGH (ref 70–99)

## 2015-01-25 SURGERY — RADIOLOGY WITH ANESTHESIA
Anesthesia: General

## 2015-01-25 MED ORDER — HEPARIN (PORCINE) IN NACL 100-0.45 UNIT/ML-% IJ SOLN
500.0000 [IU]/h | INTRAMUSCULAR | Status: AC
  Administered 2015-01-25: 500 [IU]/h via INTRAVENOUS
  Filled 2015-01-25: qty 250

## 2015-01-25 MED ORDER — FENTANYL CITRATE 0.05 MG/ML IJ SOLN
INTRAMUSCULAR | Status: AC
  Filled 2015-01-25: qty 2

## 2015-01-25 MED ORDER — ACETAMINOPHEN 650 MG RE SUPP: 650.0000 mg | Freq: Four times a day (QID) | RECTAL | Status: DC | PRN

## 2015-01-25 MED ORDER — LIDOCAINE HCL (CARDIAC) 20 MG/ML IV SOLN
INTRAVENOUS | Status: DC | PRN
  Administered 2015-01-25: 100 mg via INTRAVENOUS
  Administered 2015-01-25: 50 mg via INTRAVENOUS

## 2015-01-25 MED ORDER — HYDRALAZINE HCL 20 MG/ML IJ SOLN
INTRAMUSCULAR | Status: DC | PRN
  Administered 2015-01-25: 10 mg via INTRAVENOUS

## 2015-01-25 MED ORDER — FENTANYL CITRATE 0.05 MG/ML IJ SOLN
INTRAMUSCULAR | Status: DC | PRN
  Administered 2015-01-25 (×2): 50 ug via INTRAVENOUS

## 2015-01-25 MED ORDER — ACETAMINOPHEN 500 MG PO TABS
1000.0000 mg | ORAL_TABLET | Freq: Four times a day (QID) | ORAL | Status: DC | PRN
  Administered 2015-01-25 (×2): 1000 mg via ORAL
  Filled 2015-01-25 (×2): qty 2

## 2015-01-25 MED ORDER — ONDANSETRON HCL 4 MG/2ML IJ SOLN: 4.0000 mg | Freq: Four times a day (QID) | INTRAMUSCULAR | Status: DC | PRN

## 2015-01-25 MED ORDER — FENTANYL CITRATE 0.05 MG/ML IJ SOLN
25.0000 ug | INTRAMUSCULAR | Status: DC | PRN
  Administered 2015-01-25 (×2): 25 ug via INTRAVENOUS

## 2015-01-25 MED ORDER — NIMODIPINE 30 MG PO CAPS
60.0000 mg | ORAL_CAPSULE | ORAL | Status: DC
  Filled 2015-01-25: qty 2

## 2015-01-25 MED ORDER — ASPIRIN EC 325 MG PO TBEC
325.0000 mg | DELAYED_RELEASE_TABLET | Freq: Once | ORAL | Status: DC
  Filled 2015-01-25: qty 1

## 2015-01-25 MED ORDER — SODIUM CHLORIDE 0.9 % IV SOLN
Freq: Once | INTRAVENOUS | Status: AC
  Administered 2015-01-25 (×2): via INTRAVENOUS

## 2015-01-25 MED ORDER — DEXAMETHASONE SODIUM PHOSPHATE 4 MG/ML IJ SOLN
INTRAMUSCULAR | Status: DC | PRN
  Administered 2015-01-25: 4 mg via INTRAVENOUS
  Administered 2015-01-25: 8 mg via INTRAVENOUS

## 2015-01-25 MED ORDER — NITROGLYCERIN 1 MG/10 ML FOR IR/CATH LAB
INTRA_ARTERIAL | Status: AC
  Filled 2015-01-25: qty 10

## 2015-01-25 MED ORDER — PHENYLEPHRINE HCL 10 MG/ML IJ SOLN
INTRAMUSCULAR | Status: DC | PRN
  Administered 2015-01-25 (×2): 80 ug via INTRAVENOUS

## 2015-01-25 MED ORDER — ASPIRIN 325 MG PO TABS
325.0000 mg | ORAL_TABLET | Freq: Every day | ORAL | Status: DC
  Administered 2015-01-26: 325 mg via ORAL
  Filled 2015-01-25 (×2): qty 1

## 2015-01-25 MED ORDER — HEPARIN SODIUM (PORCINE) 1000 UNIT/ML IJ SOLN
INTRAMUSCULAR | Status: DC | PRN
  Administered 2015-01-25: 1000 [IU] via INTRAVENOUS
  Administered 2015-01-25: 2000 [IU] via INTRAVENOUS

## 2015-01-25 MED ORDER — MEPERIDINE HCL 25 MG/ML IJ SOLN: 6.2500 mg | INTRAMUSCULAR | Status: DC | PRN

## 2015-01-25 MED ORDER — PHENYLEPHRINE HCL 10 MG/ML IJ SOLN
10.0000 mg | INTRAVENOUS | Status: DC | PRN
  Administered 2015-01-25: 25 ug/min via INTRAVENOUS

## 2015-01-25 MED ORDER — IOHEXOL 300 MG/ML  SOLN
150.0000 mL | Freq: Once | INTRAMUSCULAR | Status: AC | PRN
  Administered 2015-01-25: 1 mL via INTRA_ARTERIAL

## 2015-01-25 MED ORDER — NICARDIPINE HCL IN NACL 20-0.86 MG/200ML-% IV SOLN
5.0000 mg/h | INTRAVENOUS | Status: DC
  Filled 2015-01-25: qty 200

## 2015-01-25 MED ORDER — MIDAZOLAM HCL 5 MG/5ML IJ SOLN
INTRAMUSCULAR | Status: DC | PRN
  Administered 2015-01-25 (×2): 1 mg via INTRAVENOUS

## 2015-01-25 MED ORDER — SCOPOLAMINE 1 MG/3DAYS TD PT72
1.0000 | MEDICATED_PATCH | TRANSDERMAL | Status: DC
  Administered 2015-01-25: 1.5 mg via TRANSDERMAL
  Filled 2015-01-25: qty 1

## 2015-01-25 MED ORDER — VANCOMYCIN HCL 1000 MG IV SOLR
1000.0000 mg | Freq: Once | INTRAVENOUS | Status: AC
  Administered 2015-01-25: 1000 mg via INTRAVENOUS
  Filled 2015-01-25: qty 1000

## 2015-01-25 MED ORDER — ROCURONIUM BROMIDE 100 MG/10ML IV SOLN
INTRAVENOUS | Status: DC | PRN
  Administered 2015-01-25 (×2): 10 mg via INTRAVENOUS
  Administered 2015-01-25: 50 mg via INTRAVENOUS

## 2015-01-25 MED ORDER — ONDANSETRON HCL 4 MG/2ML IJ SOLN
INTRAMUSCULAR | Status: DC | PRN
  Administered 2015-01-25 (×3): 4 mg via INTRAVENOUS

## 2015-01-25 MED ORDER — CLOPIDOGREL BISULFATE 75 MG PO TABS
75.0000 mg | ORAL_TABLET | Freq: Every day | ORAL | Status: DC
  Administered 2015-01-26: 75 mg via ORAL
  Filled 2015-01-25 (×2): qty 1

## 2015-01-25 MED ORDER — HEPARIN (PORCINE) IN NACL 100-0.45 UNIT/ML-% IJ SOLN
INTRAMUSCULAR | Status: AC
  Filled 2015-01-25: qty 250

## 2015-01-25 MED ORDER — LIDOCAINE HCL 1 % IJ SOLN
INTRAMUSCULAR | Status: AC
  Filled 2015-01-25: qty 20

## 2015-01-25 MED ORDER — NEOSTIGMINE METHYLSULFATE 10 MG/10ML IV SOLN
INTRAVENOUS | Status: DC | PRN
  Administered 2015-01-25: 2.5 mg via INTRAVENOUS

## 2015-01-25 MED ORDER — LACTATED RINGERS IV SOLN: INTRAVENOUS | Status: DC | PRN

## 2015-01-25 MED ORDER — ACETAMINOPHEN 500 MG PO TABS: 1000.0000 mg | ORAL_TABLET | Freq: Four times a day (QID) | ORAL | Status: DC | PRN

## 2015-01-25 MED ORDER — CLOPIDOGREL BISULFATE 75 MG PO TABS
75.0000 mg | ORAL_TABLET | Freq: Once | ORAL | Status: DC
  Filled 2015-01-25: qty 1

## 2015-01-25 MED ORDER — PROPOFOL 10 MG/ML IV BOLUS
INTRAVENOUS | Status: DC | PRN
  Administered 2015-01-25: 100 mg via INTRAVENOUS
  Administered 2015-01-25: 30 mg via INTRAVENOUS

## 2015-01-25 MED ORDER — SODIUM CHLORIDE 0.9 % IV SOLN
INTRAVENOUS | Status: DC
Start: 2015-01-25 — End: 2015-01-26
  Administered 2015-01-25 (×2): via INTRAVENOUS

## 2015-01-25 MED ORDER — PROMETHAZINE HCL 25 MG/ML IJ SOLN: 6.2500 mg | INTRAMUSCULAR | Status: DC | PRN

## 2015-01-25 MED ORDER — GLYCOPYRROLATE 0.2 MG/ML IJ SOLN
INTRAMUSCULAR | Status: DC | PRN
  Administered 2015-01-25: 0.3 mg via INTRAVENOUS

## 2015-01-25 NOTE — H&P (Signed)
Subjective: Patient is s/p intervention in IR, she denies any headache currently. She c/o pain in left arm access site dull ache radiating down to hand. She denies any current N/V  Allergies: Amoxicillin; Codeine; Iron; Penicillins; and Tape  Medications: Prior to Admission medications   Medication Sig Start Date End Date Taking? Authorizing Provider  albuterol (PROVENTIL HFA;VENTOLIN HFA) 108 (90 BASE) MCG/ACT inhaler Inhale 2 puffs into the lungs every 6 (six) hours as needed for wheezing or shortness of breath.   Yes Historical Provider, MD  aspirin EC 81 MG tablet Take 81 mg by mouth daily.   Yes Historical Provider, MD  atorvastatin (LIPITOR) 80 MG tablet Take 80 mg by mouth daily.   Yes Historical Provider, MD  Calcium Carbonate-Vitamin D (CALCIUM 500 + D PO) Take 1 tablet by mouth 2 (two) times daily.    Yes Historical Provider, MD  citalopram (CELEXA) 10 MG tablet Take 10 mg by mouth daily.   Yes Historical Provider, MD  clopidogrel (PLAVIX) 75 MG tablet Take 75 mg by mouth daily.   Yes Historical Provider, MD  doxycycline (VIBRA-TABS) 100 MG tablet Take 100 mg by mouth 2 (two) times daily.   Yes Historical Provider, MD  Doxylamine Succinate, Sleep, (SLEEP AID PO) Take 1-2 tablets by mouth at bedtime as needed (sleep).    Yes Historical Provider, MD  gabapentin (NEURONTIN) 300 MG capsule Take 300-600 mg by mouth at bedtime. Most of the time takes 300 mg but can take 600 mg if in significant pain   Yes Historical Provider, MD  hydroxypropyl methylcellulose / hypromellose (ISOPTO TEARS / GONIOVISC) 2.5 % ophthalmic solution Place 1 drop into both eyes 3 (three) times daily as needed for dry eyes.   Yes Historical Provider, MD  losartan-hydrochlorothiazide (HYZAAR) 50-12.5 MG per tablet Take 1 tablet by mouth daily.   Yes Historical Provider, MD  metformin (FORTAMET) 1000 MG (OSM) 24 hr tablet Take 1,000 mg by mouth 2 (two) times daily. 10/30/14  Yes Historical Provider, MD  metoprolol  (TOPROL-XL) 200 MG 24 hr tablet Take 200 mg by mouth daily.   Yes Historical Provider, MD  Multiple Vitamins-Minerals (MULTIVITAMIN WITH MINERALS) tablet Take 1 tablet by mouth at bedtime.    Yes Historical Provider, MD  Omega-3 Fatty Acids (FISH OIL) 600 MG CAPS Take 600 mg by mouth 2 (two) times daily.   Yes Historical Provider, MD  ondansetron (ZOFRAN ODT) 4 MG disintegrating tablet Take 1 tablet (4 mg total) by mouth every 8 (eight) hours as needed for nausea or vomiting. 01/16/15  Yes Bethann Berkshire, MD  oxyCODONE-acetaminophen (PERCOCET/ROXICET) 5-325 MG per tablet Take 1 tablet by mouth every 6 (six) hours as needed for moderate pain or severe pain. 01/16/15  Yes Bethann Berkshire, MD  pantoprazole (PROTONIX) 40 MG tablet Take 40 mg by mouth daily.    Yes Historical Provider, MD  potassium chloride (MICRO-K) 10 MEQ CR capsule Take 10 mEq by mouth 2 (two) times daily.    Yes Historical Provider, MD  predniSONE (DELTASONE) 10 MG tablet Take 20-60 mg by mouth See admin instructions. Days 1-4: Two tablets before breakfast, one after lunch, one after dinner, and two at bedtime. Days 5-8: One tablet before breakfast, one after lunch, one after dinner, and one at bedtime Days 9-12: One tablet before breakfast and one at bedtime   Yes Historical Provider, MD    Review of Systems  Vital Signs: BP 132/46 mmHg  Pulse 72  Temp(Src) 98.2 F (36.8 C) (Oral)  Resp 17  Ht 5\' 5"  (1.651 m)  Wt 156 lb 4.9 oz (70.9 kg)  BMI 26.01 kg/m2  SpO2 97%  Physical Exam General: A&Ox3, NAD, family bedside Heart: RRR Left brachial access dressing C/D/I, soft, NT, no signs of bleeding/hematoma, radial pulse intact Ext: Equal strength bilaterally upper and lower Neuro: Speech clear, EOMI, left pupil slower to constrict with light compared to right, fine motor intact  Imaging: No results found.  Labs:  CBC:  Recent Labs  11/17/14 0857 01/20/15 0959 01/25/15 0653 01/25/15 1350  WBC 5.9 13.0* 10.1 5.6  HGB  11.2* 13.4 12.5 11.5*  HCT 34.2* 38.9 36.5 33.5*  PLT 159 206 150 123*    COAGS:  Recent Labs  09/24/14 0736 09/24/14 1050 11/17/14 0857 01/20/15 0959  INR 0.94 1.02 1.00 1.01  APTT 20* 38* 33 25    BMP:  Recent Labs  09/24/14 0736 09/24/14 1050 11/17/14 0857 01/20/15 0959  NA 143 144 141 140  K 4.0 3.8 3.9 4.6  CL 106 105 102 104  CO2 24 28 25 30   GLUCOSE 130* 113* 114* 136*  BUN 16 14 10 20   CALCIUM 8.9 8.6 9.5 9.7  CREATININE 0.78 0.76 0.74 0.95  GFRNONAA 82* 83* 84* 59*  GFRAA >90 >90 >90 68*    LIVER FUNCTION TESTS:  Recent Labs  01/20/15 0959  BILITOT 0.9  AST 20  ALT 24  ALKPHOS 65  PROT 6.2  ALBUMIN 3.7    Assessment and Plan: LICA aneurysm  S/p embolization of Lt ICA superior hypophyseal aneurysm assoc withdysplastic adjacent ICA using the pipeline flow diverter device. Left brachial approach, dressing intact, pulses intact BP right arm cuff to stay between 110-140 systolic IV heparin until 2/2 am Liquid diet ADAT Possible discharge in am if stable     Signed: Pattricia BossMORGAN, Peyson Postema D 01/25/2015, 4:01 PM

## 2015-01-25 NOTE — Progress Notes (Signed)
Procedure end time

## 2015-01-25 NOTE — Progress Notes (Addendum)
Bp 136/43 HR 60 paged Lequita HaltMorgan, PA-C to ask about need to give Nimodipine. Waiting for call back.

## 2015-01-25 NOTE — Progress Notes (Signed)
Arm board placed under pt's elbow to assist as a reminder not to bend arm. Pt transferred to Neuro PACU for recovery assisted by 1 CRNA and this RN. Pt was a/o and verbal. C/o of headache pain 4/10, with MD aware, along with sourness to the left arm where procedure was done.

## 2015-01-25 NOTE — Procedures (Signed)
S/P Lt common carotid arteriogram followed by endovascular embolization of Lt ICA superior hypophyseal aneurysm assoc with  Dysplastic adjacent ICA using the pipeline flow diverter device.

## 2015-01-25 NOTE — Transfer of Care (Signed)
Immediate Anesthesia Transfer of Care Note  Patient: Jasmine HawthorneMary J Scaglione  Procedure(s) Performed: Procedure(s): EMBOLIZATION     (RADIOLOGY WITH ANESTHESIA ) (N/A)  Patient Location: PACU  Anesthesia Type:General  Level of Consciousness: awake, oriented, sedated, patient cooperative and responds to stimulation  Airway & Oxygen Therapy: Patient Spontanous Breathing and Patient connected to nasal cannula oxygen  Post-op Assessment: Report given to RN, Post -op Vital signs reviewed and stable, Patient moving all extremities and Patient moving all extremities X 4  Post vital signs: Reviewed and stable  Last Vitals:  Filed Vitals:   01/25/15 0707  BP: 136/43  Pulse:   Temp:   Resp:     Complications: No apparent anesthesia complications

## 2015-01-25 NOTE — Progress Notes (Signed)
Spoke with Dr.Deveshaur at patient bedside during his post procedure rounds. Blood pressure is to be taken on patients right arm and SBP to be kept between 110-140. MD is also aware that patient's left pupil is larger than right. Will continue to monitor.

## 2015-01-25 NOTE — Progress Notes (Signed)
Spoke with Elbert Ewingsorin Morgan, PA-C about Nimodipine, states to hold Nimodipine.

## 2015-01-25 NOTE — Progress Notes (Addendum)
ANTICOAGULATION CONSULT NOTE - Initial Consult  Pharmacy Consult for heparin Indication: s/p stent placement in L ICA  Allergies  Allergen Reactions  . Amoxicillin Itching  . Codeine Itching  . Iron Diarrhea  . Penicillins Itching  . Tape     Adhesives break her skin out    Patient Measurements: Height: 5\' 5"  (165.1 cm) Weight: 154 lb (69.854 kg) IBW/kg (Calculated) : 57   Vital Signs: Temp: 98.2 F (36.8 C) (02/01 1340) Temp Source: Oral (02/01 0634) BP: 92/53 mmHg (02/01 1315) Pulse Rate: 73 (02/01 1415)  Labs:  Recent Labs  01/25/15 0653 01/25/15 1350  HGB 12.5 11.5*  HCT 36.5 33.5*  PLT 150 123*  HEPARINUNFRC  --  0.51    Estimated Creatinine Clearance: 53.3 mL/min (by C-G formula based on Cr of 0.95).   Medical History: Past Medical History  Diagnosis Date  . Coronary artery disease   . Hypertension   . Dysrhythmia   . Stroke   . Peripheral vascular disease   . Diabetes mellitus without complication   . Depression   . GERD (gastroesophageal reflux disease)   . Arthritis   . Polio   . PONV (postoperative nausea and vomiting)   . Heart murmur   . Anemia   . Left bundle branch block   . Carotid aneurysm, left   . Left carotid stenosis   . Wears glasses   . Headache     HX; Migraines    Medications:  Prescriptions prior to admission  Medication Sig Dispense Refill Last Dose  . albuterol (PROVENTIL HFA;VENTOLIN HFA) 108 (90 BASE) MCG/ACT inhaler Inhale 2 puffs into the lungs every 6 (six) hours as needed for wheezing or shortness of breath.   Past Month at Unknown time  . aspirin EC 81 MG tablet Take 81 mg by mouth daily.   01/25/2015 at 0510  . atorvastatin (LIPITOR) 80 MG tablet Take 80 mg by mouth daily.   01/24/2015 at Unknown time  . Calcium Carbonate-Vitamin D (CALCIUM 500 + D PO) Take 1 tablet by mouth 2 (two) times daily.    Past Week at Unknown time  . citalopram (CELEXA) 10 MG tablet Take 10 mg by mouth daily.   01/25/2015 at 0510  .  clopidogrel (PLAVIX) 75 MG tablet Take 75 mg by mouth daily.   01/25/2015 at 0510  . doxycycline (VIBRA-TABS) 100 MG tablet Take 100 mg by mouth 2 (two) times daily.   Past Week at Unknown time  . Doxylamine Succinate, Sleep, (SLEEP AID PO) Take 1-2 tablets by mouth at bedtime as needed (sleep).    01/24/2015 at Unknown time  . gabapentin (NEURONTIN) 300 MG capsule Take 300-600 mg by mouth at bedtime. Most of the time takes 300 mg but can take 600 mg if in significant pain   01/24/2015 at Unknown time  . hydroxypropyl methylcellulose / hypromellose (ISOPTO TEARS / GONIOVISC) 2.5 % ophthalmic solution Place 1 drop into both eyes 3 (three) times daily as needed for dry eyes.   01/24/2015 at Unknown time  . losartan-hydrochlorothiazide (HYZAAR) 50-12.5 MG per tablet Take 1 tablet by mouth daily.   01/24/2015 at Unknown time  . metformin (FORTAMET) 1000 MG (OSM) 24 hr tablet Take 1,000 mg by mouth 2 (two) times daily.  3 01/24/2015 at Unknown time  . metoprolol (TOPROL-XL) 200 MG 24 hr tablet Take 200 mg by mouth daily.   01/25/2015 at 0510  . Multiple Vitamins-Minerals (MULTIVITAMIN WITH MINERALS) tablet Take 1 tablet by mouth  at bedtime.    Past Week at Unknown time  . Omega-3 Fatty Acids (FISH OIL) 600 MG CAPS Take 600 mg by mouth 2 (two) times daily.   Past Week at Unknown time  . ondansetron (ZOFRAN ODT) 4 MG disintegrating tablet Take 1 tablet (4 mg total) by mouth every 8 (eight) hours as needed for nausea or vomiting. 20 tablet 0 01/25/2015 at 0510  . oxyCODONE-acetaminophen (PERCOCET/ROXICET) 5-325 MG per tablet Take 1 tablet by mouth every 6 (six) hours as needed for moderate pain or severe pain. 10 tablet 0 Past Week at Unknown time  . pantoprazole (PROTONIX) 40 MG tablet Take 40 mg by mouth daily.    01/25/2015 at 0510  . potassium chloride (MICRO-K) 10 MEQ CR capsule Take 10 mEq by mouth 2 (two) times daily.    Past Week at Unknown time  . predniSONE (DELTASONE) 10 MG tablet Take 20-60 mg by mouth See  admin instructions. Days 1-4: Two tablets before breakfast, one after lunch, one after dinner, and two at bedtime. Days 5-8: One tablet before breakfast, one after lunch, one after dinner, and one at bedtime Days 9-12: One tablet before breakfast and one at bedtime   Past Week at Unknown time    Assessment: 74 yo F s/p L common carotid arteriogram with endovascular embolization of L ICA aneurysm using the pipeline flow diverter device.  Pharmacy consulted to dose IV heparin for s/p stent placement in L ICA.  Wt 69.9 kg. Hg 12.5>11.5, hct 36.5>33.5, pltc 150>123.   Heparin started at 1318 in NPACU at rate of 500 units/hr HL drawn at 1350 = 0.51. - she received a total of 3000 units bolus around 10am so this could be bolus effect. No bleeding reported  Goal of Therapy:  Heparin level 0.1- 0.25 units/ml Monitor platelets by anticoagulation protocol: Yes   Plan:  -heparin started in NPACU at 500 units/hr at 1318, increase rate to 700 units/hr -check HL in 8 hrs - heparin to be turned off at 0700 for sheath pull  Herby Abraham, Pharm.D. [phone] 01/25/2015 2:41 PM

## 2015-01-25 NOTE — Anesthesia Postprocedure Evaluation (Signed)
  Anesthesia Post-op Note  Patient: Jasmine HawthorneMary J Moon  Procedure(s) Performed: Procedure(s): EMBOLIZATION     (RADIOLOGY WITH ANESTHESIA ) (N/A)  Patient Location: PACU  Anesthesia Type:General  Level of Consciousness: awake and alert   Airway and Oxygen Therapy: Patient Spontanous Breathing and Patient connected to nasal cannula oxygen  Post-op Pain: mild  Post-op Assessment: Post-op Vital signs reviewed and Patient's Cardiovascular Status Stable  Post-op Vital Signs: Reviewed and stable  Last Vitals:  Filed Vitals:   01/25/15 1325  BP:   Pulse: 73  Temp:   Resp: 20    Complications: No apparent anesthesia complications

## 2015-01-25 NOTE — H&P (Signed)
History of Present Illness: Jasmine Moon is a 74 y.o. female with a 3 mm left paraclinoid ICA aneurysm and history of amaurosis fugax of left eye in sept 2015 and still with c/o left eye blurriness. She has been scheduled today for a cerebral arteriogram and possible embolization. She recently presented to ED on 01/16/15 for c/o headache with CTA done. She denies any current headache or extremity weakness. She denies any chest pain, shortness of breath or palpitations. She denies any active signs of bleeding or excessive bruising. She denies any recent fever or chills. The patient denies any history of sleep apnea or chronic oxygen use.   Past Medical History  Diagnosis Date  . Coronary artery disease   . Hypertension   . Dysrhythmia   . Stroke   . Peripheral vascular disease   . Diabetes mellitus without complication   . Depression   . GERD (gastroesophageal reflux disease)   . Arthritis   . Polio   . PONV (postoperative nausea and vomiting)   . Heart murmur   . Anemia   . Left bundle branch block   . Carotid aneurysm, left   . Left carotid stenosis   . Wears glasses   . Headache     HX; Migraines    Past Surgical History  Procedure Laterality Date  . Vascular surgery    . Cardiac catheterization    . Cholecystectomy    . Abdominal aortic aneurysm repair    . Hernia repair    . Renal artery stent    . Radiology with anesthesia N/A 11/30/2014    Procedure: EMBOLIZATION;  Surgeon: Oneal Grout, MD;  Location: [facility] OR;  Service: Radiology;  Laterality: N/A;  . Colonoscopy    . Dilation and curettage of uterus      Allergies: Amoxicillin; Codeine; Iron; Penicillins; and Tape  Medications: Prior to Admission medications   Medication Sig Start Date End Date Taking? Authorizing Provider  albuterol (PROVENTIL HFA;VENTOLIN HFA) 108 (90 BASE) MCG/ACT inhaler Inhale 2 puffs into the lungs every 6 (six) hours as needed for wheezing or shortness of breath.    Historical  Provider, MD  aspirin EC 81 MG tablet Take 81 mg by mouth daily.    Historical Provider, MD  atorvastatin (LIPITOR) 80 MG tablet Take 80 mg by mouth daily.    Historical Provider, MD  Calcium Carbonate-Vitamin D (CALCIUM 500 + D PO) Take 1 tablet by mouth 2 (two) times daily.     Historical Provider, MD  citalopram (CELEXA) 10 MG tablet Take 10 mg by mouth daily.    Historical Provider, MD  clopidogrel (PLAVIX) 75 MG tablet Take 75 mg by mouth daily.    Historical Provider, MD  doxycycline (VIBRA-TABS) 100 MG tablet Take 100 mg by mouth 2 (two) times daily.    Historical Provider, MD  Doxylamine Succinate, Sleep, (SLEEP AID PO) Take 1-2 tablets by mouth at bedtime as needed (sleep).     Historical Provider, MD  gabapentin (NEURONTIN) 300 MG capsule Take 300-600 mg by mouth at bedtime. Most of the time takes 300 mg but can take 600 mg if in significant pain    Historical Provider, MD  hydroxypropyl methylcellulose / hypromellose (ISOPTO TEARS / GONIOVISC) 2.5 % ophthalmic solution Place 1 drop into both eyes 3 (three) times daily as needed for dry eyes.    Historical Provider, MD  losartan-hydrochlorothiazide (HYZAAR) 50-12.5 MG per tablet Take 1 tablet by mouth daily.    Historical Provider,  MD  metformin (FORTAMET) 1000 MG (OSM) 24 hr tablet Take 1,000 mg by mouth 2 (two) times daily. 10/30/14   Historical Provider, MD  metoprolol (TOPROL-XL) 200 MG 24 hr tablet Take 200 mg by mouth daily.    Historical Provider, MD  Multiple Vitamins-Minerals (MULTIVITAMIN WITH MINERALS) tablet Take 1 tablet by mouth at bedtime.     Historical Provider, MD  Omega-3 Fatty Acids (FISH OIL) 600 MG CAPS Take 600 mg by mouth 2 (two) times daily.    Historical Provider, MD  ondansetron (ZOFRAN ODT) 4 MG disintegrating tablet Take 1 tablet (4 mg total) by mouth every 8 (eight) hours as needed for nausea or vomiting. 01/16/15   Bethann Berkshire, MD  oxyCODONE-acetaminophen (PERCOCET/ROXICET) 5-325 MG per tablet Take 1 tablet by  mouth every 6 (six) hours as needed for moderate pain or severe pain. 01/16/15   Bethann Berkshire, MD  pantoprazole (PROTONIX) 40 MG tablet Take 40 mg by mouth daily.     Historical Provider, MD  potassium chloride (MICRO-K) 10 MEQ CR capsule Take 10 mEq by mouth 2 (two) times daily.     Historical Provider, MD  predniSONE (DELTASONE) 10 MG tablet Take 20-60 mg by mouth See admin instructions. Days 1-4: Two tablets before breakfast, one after lunch, one after dinner, and two at bedtime. Days 5-8: One tablet before breakfast, one after lunch, one after dinner, and one at bedtime Days 9-12: One tablet before breakfast and one at bedtime    Historical Provider, MD    Family History  Problem Relation Age of Onset  . Wilson's disease Other     History   Social History  . Marital Status: Married    Spouse Name: N/A    Number of Children: N/A  . Years of Education: N/A   Social History Main Topics  . Smoking status: Former Smoker    Types: Cigarettes  . Smokeless tobacco: Never Used     Comment: " quit smoking around 74 years old"  . Alcohol Use: No  . Drug Use: No  . Sexual Activity: None   Other Topics Concern  . None   Social History Narrative   Review of Systems: A 12 point ROS discussed and pertinent positives are indicated in the HPI above.  All other systems are negative.  Review of Systems  Vital Signs: T: 97 F, HR: 60 bpm, BP: 136/43 mmHg, 02: 97% RA  Physical Exam  Constitutional: She is oriented to person, place, and time. No distress.  HENT:  Head: Normocephalic and atraumatic.  Neck: No tracheal deviation present.  Cardiovascular: Normal rate and regular rhythm.  Exam reveals no gallop and no friction rub.   Murmur heard. Pulmonary/Chest: Effort normal and breath sounds normal. No respiratory distress. She has no wheezes. She has no rales.  Abdominal: Soft. Bowel sounds are normal. She exhibits no distension. There is no tenderness.  Neurological: She is alert and  oriented to person, place, and time.  Left eye droop, smile asymmetrical, tongue midline,speech clear, equal strength bilaterally upper and lower extremities   Skin: Skin is warm and dry. She is not diaphoretic.  Psychiatric: She has a normal mood and affect. Her behavior is normal. Thought content normal.    Imaging: Ct Angio Head W/cm &/or Wo Cm  01/16/2015   CLINICAL DATA:  Headache  EXAM: CT ANGIOGRAPHY HEAD AND NECK  TECHNIQUE: Multidetector CT imaging of the head and neck was performed using the standard protocol during bolus administration of intravenous contrast.  Multiplanar CT image reconstructions and MIPs were obtained to evaluate the vascular anatomy. Carotid stenosis measurements (when applicable) are obtained utilizing NASCET criteria, using the distal internal carotid diameter as the denominator.  CONTRAST:  50mL OMNIPAQUE IOHEXOL 350 MG/ML SOLN  COMPARISON:  09/02/2014  FINDINGS: Due to error, 2 mm slices are the thinnest available currently.  CT HEAD  Skull and Sinuses:Negative for fracture or destructive process. The mastoids, middle ears, and imaged paranasal sinuses are clear.  Orbits: No acute abnormality.  Brain: No evidence of acute infarction, hemorrhage, hydrocephalus, or mass lesion/mass effect.  CTA NECK  Aortic arch: Extensive atherosclerotic calcification. No evidence of aneurysm or dissection. There has been stenting of the innominate and left common carotid arteries with extrusion of the graft into the arch which is stable from prior. There is compression of the proximal left common carotid artery stent which is stable from prior. Kinking and end stent stenosis of the innominate stent is also stable, recently characterized by catheter angiography. 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 There is a right subclavian to carotid bypass which is also patent, with moderate long segment stenosis of the  subclavian proximal to the anastomosis.  Right carotid system: Innominate end stent stenosis is unchanged, characterized by recent catheter angiography. There is atherosclerotic calcification at the bifurcation without significant stenosis. No evidence of dissection or other acute change.  Left carotid system: Stenting of the proximal common carotid artery which is patent. There is a left subclavian to carotid bypass which is widely patent, although there is inflow disease of the subclavian artery with high-grade stenosis. Atherosclerotic calcification at the bifurcation without significant ICA stenosis. No dissection or acute vascular change.  Vertebral arteries: Mild right dominance. As before there is high-grade stenosis at the left vertebral artery origin from calcified and noncalcified plaque. Multi focal calcified plaque in the left V2 segment without flow limiting stenosis. Atherosclerotic calcification throughout the right vertebral artery without evidence of flow limiting stenosis.  Other neck: Prominent but stable mediastinal nodes in the peritracheal station.  Bronchial wall thickening in the bilateral upper lungs.  CTA HEAD  Anterior circulation: Moderate atherosclerotic calcification the bilateral cavernous sinuses. No high-grade stenosis. A 3 mm inferior and posteriorly directed aneurysm from the left paraclinoid segment is stable a 3 mm. There is a dome nipple which is also stable given differences in technique. No notable intracranial stenosis. No evidence of vascular malformation.  Posterior circulation: The non dominant left vertebral artery ends in the PICA. No stenosis or occlusion. The right vertebral artery flows into a normal basilar artery. Hypoplastic right P1 in the setting of large posterior communicating artery. The vascular occlusion.  Venous sinuses: Patent  Anatomic variants: Fetal type right PCA.  Delayed phase: Negative.  IMPRESSION: 1. No acute intracranial or arterial findings. 2.  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 3. Patent bilateral subclavian to carotid graft. 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 4. 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 The left vertebral artery ends in the left PICA.   Electronically Signed   By: Tiburcio Pea M.D.   On: 01/16/2015 21:21   Ct Angio Neck W/cm &/or Wo/cm  01/16/2015   CLINICAL DATA:  Headache  EXAM: CT ANGIOGRAPHY HEAD AND NECK  TECHNIQUE: Multidetector CT imaging of the head and neck was performed using the standard protocol during  bolus administration of intravenous contrast. Multiplanar CT image reconstructions and MIPs were obtained to evaluate the vascular anatomy. Carotid stenosis measurements (when applicable) are obtained utilizing NASCET criteria, using the distal internal carotid diameter as the denominator.  CONTRAST:  50mL OMNIPAQUE IOHEXOL 350 MG/ML SOLN  COMPARISON:  09/02/2014  FINDINGS: Due to error, 2 mm slices are the thinnest available currently.  CT HEAD  Skull and Sinuses:Negative for fracture or destructive process. The mastoids, middle ears, and imaged paranasal sinuses are clear.  Orbits: No acute abnormality.  Brain: No evidence of acute infarction, hemorrhage, hydrocephalus, or mass lesion/mass effect.  CTA NECK  Aortic arch: Extensive atherosclerotic calcification. No evidence of aneurysm or dissection. There has been stenting of the innominate and left common carotid arteries with extrusion of the graft into the arch which is stable from prior. There is compression of the proximal left common carotid artery stent which is stable from prior. Kinking and end stent stenosis of the innominate stent is also stable, recently characterized by catheter angiography. 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 There is a right subclavian to  carotid bypass which is also patent, with moderate long segment stenosis of the subclavian proximal to the anastomosis.  Right carotid system: Innominate end stent stenosis is unchanged, characterized by recent catheter angiography. There is atherosclerotic calcification at the bifurcation without significant stenosis. No evidence of dissection or other acute change.  Left carotid system: Stenting of the proximal common carotid artery which is patent. There is a left subclavian to carotid bypass which is widely patent, although there is inflow disease of the subclavian artery with high-grade stenosis. Atherosclerotic calcification at the bifurcation without significant ICA stenosis. No dissection or acute vascular change.  Vertebral arteries: Mild right dominance. As before there is high-grade stenosis at the left vertebral artery origin from calcified and noncalcified plaque. Multi focal calcified plaque in the left V2 segment without flow limiting stenosis. Atherosclerotic calcification throughout the right vertebral artery without evidence of flow limiting stenosis.  Other neck: Prominent but stable mediastinal nodes in the peritracheal station.  Bronchial wall thickening in the bilateral upper lungs.  CTA HEAD  Anterior circulation: Moderate atherosclerotic calcification the bilateral cavernous sinuses. No high-grade stenosis. A 3 mm inferior and posteriorly directed aneurysm from the left paraclinoid segment is stable a 3 mm. There is a dome nipple which is also stable given differences in technique. No notable intracranial stenosis. No evidence of vascular malformation.  Posterior circulation: The non dominant left vertebral artery ends in the PICA. No stenosis or occlusion. The right vertebral artery flows into a normal basilar artery. Hypoplastic right P1 in the setting of large posterior communicating artery. The vascular occlusion.  Venous sinuses: Patent  Anatomic variants: Fetal type right PCA.  Delayed  phase: Negative.  IMPRESSION: 1. No acute intracranial or arterial findings. 2. 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 3. Patent bilateral subclavian to carotid graft. 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 4. 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 The left vertebral artery ends in the left PICA.   Electronically Signed   By: Tiburcio PeaJonathan  Watts M.D.   On: 01/16/2015 21:21    Labs:  CBC:  Recent Labs  09/24/14 1050 11/17/14 0857 01/20/15 0959 01/25/15 0653  WBC 5.3 5.9 13.0* 10.1  HGB 10.8* 11.2* 13.4 12.5  HCT 32.7* 34.2* 38.9  36.5  PLT 140* 159 206 150    COAGS:  Recent Labs  09/24/14 0736 09/24/14 1050 11/17/14 0857 01/20/15 0959  INR 0.94 1.02 1.00 1.01  APTT 20* 38* 33 25    BMP:  Recent Labs  09/24/14 0736 09/24/14 1050 11/17/14 0857 01/20/15 0959  NA 143 144 141 140  K 4.0 3.8 3.9 4.6  CL 106 105 102 104  CO2 24 28 25 30   GLUCOSE 130* 113* 114* 136*  BUN 16 14 10 20   CALCIUM 8.9 8.6 9.5 9.7  CREATININE 0.78 0.76 0.74 0.95  GFRNONAA 82* 83* 84* 59*  GFRAA >90 >90 >90 68*    LIVER FUNCTION TESTS:  Recent Labs  01/20/15 0959  BILITOT 0.9  AST 20  ALT 24  ALKPHOS 65  PROT 6.2  ALBUMIN 3.7   Assessment and Plan: Left internal carotid artery aneurysm.  Scheduled today for cerebral arteriogram and possible embolization of LICA aneurysm  Patient has been NPO, on aspirin 81mg  daily and plavix 75mg  daily, p2y12 is 41 today Risks and Benefits discussed with the patient. All of the patient's questions were answered, patient is agreeable to proceed. Consent signed and in chart. Patient will be admitted overnight to Neuro ICU for observation with possible discharge in am ED with headache 01/16/15 and CTA done, no acute findings, Unchanged 3 mm left paraclinoid ICA aneurysm    Signed: Berneta Levins 01/25/2015, 8:19 AM

## 2015-01-25 NOTE — Anesthesia Procedure Notes (Addendum)
Procedure Name: Intubation Date/Time: 01/25/2015 9:20 AM Performed by: Wray KearnsFOLEY, Wauneta Silveria A Pre-anesthesia Checklist: Patient identified, Emergency Drugs available, Suction available, Patient being monitored and Timeout performed Patient Re-evaluated:Patient Re-evaluated prior to inductionOxygen Delivery Method: Circle system utilized Preoxygenation: Pre-oxygenation with 100% oxygen Intubation Type: IV induction Ventilation: Mask ventilation without difficulty Laryngoscope Size: Mac and 4 Grade View: Grade I Tube type: Oral Tube size: 7.5 mm Number of attempts: 1 Airway Equipment and Method: Stylet Placement Confirmation: ETT inserted through vocal cords under direct vision,  positive ETCO2 and breath sounds checked- equal and bilateral Secured at: 21 cm Tube secured with: Tape Dental Injury: Teeth and Oropharynx as per pre-operative assessment

## 2015-01-26 ENCOUNTER — Encounter (HOSPITAL_COMMUNITY): Payer: Self-pay | Admitting: Interventional Radiology

## 2015-01-26 DIAGNOSIS — I671 Cerebral aneurysm, nonruptured: Secondary | ICD-10-CM | POA: Diagnosis not present

## 2015-01-26 LAB — CBC WITH DIFFERENTIAL/PLATELET
Basophils Absolute: 0 10*3/uL (ref 0.0–0.1)
Basophils Relative: 0 % (ref 0–1)
Eosinophils Absolute: 0 10*3/uL (ref 0.0–0.7)
Eosinophils Relative: 0 % (ref 0–5)
HCT: 29.2 % — ABNORMAL LOW (ref 36.0–46.0)
Hemoglobin: 10.2 g/dL — ABNORMAL LOW (ref 12.0–15.0)
Lymphocytes Relative: 21 % (ref 12–46)
Lymphs Abs: 1.2 10*3/uL (ref 0.7–4.0)
MCH: 29.7 pg (ref 26.0–34.0)
MCHC: 34.9 g/dL (ref 30.0–36.0)
MCV: 85.1 fL (ref 78.0–100.0)
Monocytes Absolute: 0.4 10*3/uL (ref 0.1–1.0)
Monocytes Relative: 8 % (ref 3–12)
Neutro Abs: 4 10*3/uL (ref 1.7–7.7)
Neutrophils Relative %: 71 % (ref 43–77)
Platelets: 136 10*3/uL — ABNORMAL LOW (ref 150–400)
RBC: 3.43 MIL/uL — ABNORMAL LOW (ref 3.87–5.11)
RDW: 15.3 % (ref 11.5–15.5)
WBC: 5.7 10*3/uL (ref 4.0–10.5)

## 2015-01-26 LAB — BASIC METABOLIC PANEL
Anion gap: 8 (ref 5–15)
BUN: 11 mg/dL (ref 6–23)
CO2: 22 mmol/L (ref 19–32)
Calcium: 7.4 mg/dL — ABNORMAL LOW (ref 8.4–10.5)
Chloride: 112 mmol/L (ref 96–112)
Creatinine, Ser: 0.72 mg/dL (ref 0.50–1.10)
GFR calc Af Amer: >90 mL/min (ref >90)
GFR calc non Af Amer: 84 mL/min — ABNORMAL LOW (ref >90)
Glucose, Bld: 106 mg/dL — ABNORMAL HIGH (ref 70–99)
Potassium: 2.8 mmol/L — ABNORMAL LOW (ref 3.5–5.1)
Sodium: 142 mmol/L (ref 135–145)

## 2015-01-26 LAB — GLUCOSE, CAPILLARY: Glucose-Capillary: 225 mg/dL — ABNORMAL HIGH (ref 70–99)

## 2015-01-26 LAB — PLATELET INHIBITION P2Y12: Platelet Function  P2Y12: 122 [PRU] — ABNORMAL LOW (ref 194–418)

## 2015-01-26 MED ORDER — POTASSIUM CHLORIDE CRYS ER 20 MEQ PO TBCR
20.0000 meq | EXTENDED_RELEASE_TABLET | Freq: Two times a day (BID) | ORAL | Status: DC
  Administered 2015-01-26: 20 meq via ORAL
  Filled 2015-01-26: qty 1

## 2015-01-26 MED ORDER — POTASSIUM CHLORIDE CRYS ER 20 MEQ PO TBCR
20.0000 meq | EXTENDED_RELEASE_TABLET | ORAL | Status: AC
  Administered 2015-01-26: 20 meq via ORAL
  Filled 2015-01-26: qty 1

## 2015-01-26 NOTE — Discharge Summary (Signed)
Patient ID: Jasmine Moon MRN: [id number] DOB/AGE: [birth year] 74 y.o.  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 Embolization with Pipeline flow diverter device placed 01/25/15 in IR with Dr Corliss Skains. Pt tolerated procedure well Admitted overnight to Neuro ICU. No issues overnight. Eating well. Pt did not sleep that well---felt cold. Denies N/V/D; No abd pain; No headache; No pain Seeing and hearing well; No speech changes. Passing gas; urinating well---urinating on own. Dr Corliss Skains has seen and examined pt.  Continue all home meds; especially ASA  And Plavix. Follow up 2 weeks. Pt has good understanding of plan. And discharge instructions.  Consults: None  Significant Diagnostic Studies: Cerebral arteriogram  Treatments: L internal carotid artery aneurysm embolization with Pipeline flow diverter stent  Discharge Exam: Blood pressure 112/40, pulse 80, temperature 98 F (36.7 C), temperature source Oral, resp. rate 22, height 5\' 5"  (1.651 m), weight 70.9 kg (156 lb 4.9 oz), SpO2 99 %.  PE:  A/O; appropriate Up in chair; pleasant Labs checked---potassium replacement Tongue midline; face assymtetrical (known previous CVA and polio) Heart: RRR Lungs: CTA Extr: FROM; Let arm site clean and dry NT; no hematoma 2+ pulses Abd: soft; +BS UOP good; yellow P2y12 122 this am  Results for orders placed or performed during the hospital encounter of 01/25/15  Glucose, capillary  Result Value Ref Range   Glucose-Capillary 102 (H) 70 - 99 mg/dL  CBC with Differential/Platelet  Result Value Ref Range   WBC 10.1 4.0 - 10.5 K/uL   RBC 4.28 3.87 - 5.11 MIL/uL   Hemoglobin 12.5 12.0 - 15.0 g/dL   HCT 40.9  81.1 - 91.4 %   MCV 85.3 78.0 - 100.0 fL   MCH 29.2 26.0 - 34.0 pg   MCHC 34.2 30.0 - 36.0 g/dL   RDW 78.2 95.6 - 21.3 %   Platelets 150 150 - 400 K/uL   Neutrophils Relative % 69 43 - 77 %   Neutro Abs 6.9 1.7 - 7.7 K/uL   Lymphocytes Relative 21 12 - 46 %   Lymphs Abs 2.1 0.7 - 4.0 K/uL   Monocytes Relative 8 3 - 12 %   Monocytes Absolute 0.8 0.1 - 1.0 K/uL   Eosinophils Relative 2 0 - 5 %   Eosinophils Absolute 0.2 0.0 - 0.7 K/uL   Basophils Relative 0 0 - 1 %   Basophils Absolute 0.0 0.0 - 0.1 K/uL  Platelet inhibition p2y12  Result Value Ref Range   Platelet Function  P2Y12 41 (L) 194 - 418 PRU  CBC WITH DIFFERENTIAL  Result Value Ref Range   WBC 5.6 4.0 - 10.5 K/uL   RBC 3.86 (L) 3.87 - 5.11 MIL/uL   Hemoglobin 11.5 (L) 12.0 - 15.0 g/dL   HCT 08.6 (L) 57.8 - 46.9 %   MCV 86.8 78.0 - 100.0 fL   MCH 29.8 26.0 - 34.0 pg   MCHC 34.3 30.0 - 36.0 g/dL   RDW 62.9 52.8 - 41.3 %   Platelets 123 (L) 150 - 400 K/uL   Neutrophils Relative % 86 (H) 43 - 77 %   Neutro Abs 4.8 1.7 - 7.7 K/uL   Lymphocytes Relative 11 (L) 12 - 46 %   Lymphs Abs 0.6 (L) 0.7 - 4.0 K/uL   Monocytes Relative  2 (L) 3 - 12 %   Monocytes Absolute 0.1 0.1 - 1.0 K/uL   Eosinophils Relative 1 0 - 5 %   Eosinophils Absolute 0.0 0.0 - 0.7 K/uL   Basophils Relative 0 0 - 1 %   Basophils Absolute 0.0 0.0 - 0.1 K/uL  Heparin level  Result Value Ref Range   Heparin Unfractionated 0.51 0.30 - 0.70 IU/mL  Glucose, capillary  Result Value Ref Range   Glucose-Capillary 173 (H) 70 - 99 mg/dL  Heparin level (unfractionated)  Result Value Ref Range   Heparin Unfractionated 0.50 0.30 - 0.70 IU/mL  Basic metabolic panel  Result Value Ref Range   Sodium 142 135 - 145 mmol/L   Potassium 2.8 (L) 3.5 - 5.1 mmol/L   Chloride 112 96 - 112 mmol/L   CO2 22 19 - 32 mmol/L   Glucose, Bld 106 (H) 70 - 99 mg/dL   BUN 11 6 - 23 mg/dL   Creatinine, Ser 8.11 0.50 - 1.10 mg/dL   Calcium 7.4 (L) 8.4 - 10.5 mg/dL   GFR calc  non Af Amer 84 (L) >90 mL/min   GFR calc Af Amer >90 >90 mL/min   Anion gap 8 5 - 15  CBC WITH DIFFERENTIAL  Result Value Ref Range   WBC 5.7 4.0 - 10.5 K/uL   RBC 3.43 (L) 3.87 - 5.11 MIL/uL   Hemoglobin 10.2 (L) 12.0 - 15.0 g/dL   HCT 91.4 (L) 78.2 - 95.6 %   MCV 85.1 78.0 - 100.0 fL   MCH 29.7 26.0 - 34.0 pg   MCHC 34.9 30.0 - 36.0 g/dL   RDW 21.3 08.6 - 57.8 %   Platelets 136 (L) 150 - 400 K/uL   Neutrophils Relative % 71 43 - 77 %   Neutro Abs 4.0 1.7 - 7.7 K/uL   Lymphocytes Relative 21 12 - 46 %   Lymphs Abs 1.2 0.7 - 4.0 K/uL   Monocytes Relative 8 3 - 12 %   Monocytes Absolute 0.4 0.1 - 1.0 K/uL   Eosinophils Relative 0 0 - 5 %   Eosinophils Absolute 0.0 0.0 - 0.7 K/uL   Basophils Relative 0 0 - 1 %   Basophils Absolute 0.0 0.0 - 0.1 K/uL  Platelet inhibition p2y12  Result Value Ref Range   Platelet Function  P2Y12 122 (L) 194 - 418 PRU  Glucose, capillary  Result Value Ref Range   Glucose-Capillary 225 (H) 70 - 99 mg/dL     Disposition: L ICA aneurysm Pipeline flow diverter stent for embolization 01/25/15 Has done well; no complications Plan for dc after lunch and final Potassium medication. Continue all home meds Continue ASA and Plavix Follow up with Dr Corliss Skains 2 weeks (scheduler will call pt with appt time and date) Dr Corliss Skains has seen and examined pt She has full understanding of plan; and instructions   Discharge Instructions    Call MD for:  difficulty breathing, headache or visual disturbances    Complete by:  As directed      Call MD for:  extreme fatigue    Complete by:  As directed      Call MD for:  hives    Complete by:  As directed      Call MD for:  persistant dizziness or light-headedness    Complete by:  As directed      Call MD for:  persistant nausea and vomiting    Complete by:  As directed  Call MD for:  redness, tenderness, or signs of infection (pain, swelling, redness, odor or green/yellow discharge around incision site)     Complete by:  As directed      Call MD for:  severe uncontrolled pain    Complete by:  As directed      Call MD for:  temperature >100.4    Complete by:  As directed      Diet - low sodium heart healthy    Complete by:  As directed      Discharge instructions    Complete by:  As directed   Follow up 2 weeks with Dr Corliss Skains; pt will hear from scheduler with time and date; cont ASA and Plavix daily; call [phone] if questions or concerns     Discharge wound care:    Complete by:  As directed   Keep left arm site clean and dry     Driving Restrictions    Complete by:  As directed   No driving x 2 weeks     Increase activity slowly    Complete by:  As directed      Lifting restrictions    Complete by:  As directed   No lifting over 10 lbs x 2 weeks     Other Restrictions    Complete by:  As directed   May shower today; keep clean band aid on Left arm site            Medication List    TAKE these medications        albuterol 108 (90 BASE) MCG/ACT inhaler  Commonly known as:  PROVENTIL HFA;VENTOLIN HFA  Inhale 2 puffs into the lungs every 6 (six) hours as needed for wheezing or shortness of breath.     aspirin EC 81 MG tablet  Take 81 mg by mouth daily.     atorvastatin 80 MG tablet  Commonly known as:  LIPITOR  Take 80 mg by mouth daily.     CALCIUM 500 + D PO  Take 1 tablet by mouth 2 (two) times daily.     citalopram 10 MG tablet  Commonly known as:  CELEXA  Take 10 mg by mouth daily.     clopidogrel 75 MG tablet  Commonly known as:  PLAVIX  Take 75 mg by mouth daily.     doxycycline 100 MG tablet  Commonly known as:  VIBRA-TABS  Take 100 mg by mouth 2 (two) times daily.     Fish Oil 600 MG Caps  Take 600 mg by mouth 2 (two) times daily.     gabapentin 300 MG capsule  Commonly known as:  NEURONTIN  Take 300-600 mg by mouth at bedtime. Most of the time takes 300 mg but can take 600 mg if in significant pain     hydroxypropyl methylcellulose /  hypromellose 2.5 % ophthalmic solution  Commonly known as:  ISOPTO TEARS / GONIOVISC  Place 1 drop into both eyes 3 (three) times daily as needed for dry eyes.     losartan-hydrochlorothiazide 50-12.5 MG per tablet  Commonly known as:  HYZAAR  Take 1 tablet by mouth daily.     metformin 1000 MG (OSM) 24 hr tablet  Commonly known as:  FORTAMET  Take 1,000 mg by mouth 2 (two) times daily.     metoprolol 200 MG 24 hr tablet  Commonly known as:  TOPROL-XL  Take 200 mg by mouth daily.     multivitamin with minerals tablet  Take 1 tablet by mouth at bedtime.     ondansetron 4 MG disintegrating tablet  Commonly known as:  ZOFRAN ODT  Take 1 tablet (4 mg total) by mouth every 8 (eight) hours as needed for nausea or vomiting.     oxyCODONE-acetaminophen 5-325 MG per tablet  Commonly known as:  PERCOCET/ROXICET  Take 1 tablet by mouth every 6 (six) hours as needed for moderate pain or severe pain.     pantoprazole 40 MG tablet  Commonly known as:  PROTONIX  Take 40 mg by mouth daily.     potassium chloride 10 MEQ CR capsule  Commonly known as:  MICRO-K  Take 10 mEq by mouth 2 (two) times daily.     predniSONE 10 MG tablet  Commonly known as:  DELTASONE  - Take 20-60 mg by mouth See admin instructions. Days 1-4: Two tablets before breakfast, one after lunch, one after dinner, and two at bedtime.  - Days 5-8: One tablet before breakfast, one after lunch, one after dinner, and one at bedtime  - Days 9-12: One tablet before breakfast and one at bedtime     SLEEP AID PO  Take 1-2 tablets by mouth at bedtime as needed (sleep).         I have spent greater than 30 minutes) coordinating discharge for Mikeal HawthorneMary J Brunn.    Signed: Robet Leuamela A Lenward Able [facility] 01/26/2015, 10:44 AM

## 2015-01-26 NOTE — Progress Notes (Signed)
UR completed.  Darrian Goodwill, RN BSN [facility] CCM Trauma/Neuro ICU Case Manager [phone]  

## 2015-01-26 NOTE — Progress Notes (Signed)
Referring Physician(s): Deveshwar  Subjective:  L ICA aneurysm- pipeline stent placed 2/1 in IR with Dr Corliss Skainseveshwar Pt has done well overnight No N/V No headache Slept ok---"cold" Probable dc today  Allergies: Amoxicillin; Codeine; Iron; Penicillins; and Tape  Medications: Prior to Admission medications   Medication Sig Start Date End Date Taking? Authorizing Provider  albuterol (PROVENTIL HFA;VENTOLIN HFA) 108 (90 BASE) MCG/ACT inhaler Inhale 2 puffs into the lungs every 6 (six) hours as needed for wheezing or shortness of breath.   Yes Historical Provider, MD  aspirin EC 81 MG tablet Take 81 mg by mouth daily.   Yes Historical Provider, MD  atorvastatin (LIPITOR) 80 MG tablet Take 80 mg by mouth daily.   Yes Historical Provider, MD  Calcium Carbonate-Vitamin D (CALCIUM 500 + D PO) Take 1 tablet by mouth 2 (two) times daily.    Yes Historical Provider, MD  citalopram (CELEXA) 10 MG tablet Take 10 mg by mouth daily.   Yes Historical Provider, MD  clopidogrel (PLAVIX) 75 MG tablet Take 75 mg by mouth daily.   Yes Historical Provider, MD  doxycycline (VIBRA-TABS) 100 MG tablet Take 100 mg by mouth 2 (two) times daily.   Yes Historical Provider, MD  Doxylamine Succinate, Sleep, (SLEEP AID PO) Take 1-2 tablets by mouth at bedtime as needed (sleep).    Yes Historical Provider, MD  gabapentin (NEURONTIN) 300 MG capsule Take 300-600 mg by mouth at bedtime. Most of the time takes 300 mg but can take 600 mg if in significant pain   Yes Historical Provider, MD  hydroxypropyl methylcellulose / hypromellose (ISOPTO TEARS / GONIOVISC) 2.5 % ophthalmic solution Place 1 drop into both eyes 3 (three) times daily as needed for dry eyes.   Yes Historical Provider, MD  losartan-hydrochlorothiazide (HYZAAR) 50-12.5 MG per tablet Take 1 tablet by mouth daily.   Yes Historical Provider, MD  metformin (FORTAMET) 1000 MG (OSM) 24 hr tablet Take 1,000 mg by mouth 2 (two) times daily. 10/30/14  Yes Historical  Provider, MD  metoprolol (TOPROL-XL) 200 MG 24 hr tablet Take 200 mg by mouth daily.   Yes Historical Provider, MD  Multiple Vitamins-Minerals (MULTIVITAMIN WITH MINERALS) tablet Take 1 tablet by mouth at bedtime.    Yes Historical Provider, MD  Omega-3 Fatty Acids (FISH OIL) 600 MG CAPS Take 600 mg by mouth 2 (two) times daily.   Yes Historical Provider, MD  ondansetron (ZOFRAN ODT) 4 MG disintegrating tablet Take 1 tablet (4 mg total) by mouth every 8 (eight) hours as needed for nausea or vomiting. 01/16/15  Yes Bethann BerkshireAaron Schmitt, MD  oxyCODONE-acetaminophen (PERCOCET/ROXICET) 5-325 MG per tablet Take 1 tablet by mouth every 6 (six) hours as needed for moderate pain or severe pain. 01/16/15  Yes Bethann BerkshireAaron Schmitt, MD  pantoprazole (PROTONIX) 40 MG tablet Take 40 mg by mouth daily.    Yes Historical Provider, MD  potassium chloride (MICRO-K) 10 MEQ CR capsule Take 10 mEq by mouth 2 (two) times daily.    Yes Historical Provider, MD  predniSONE (DELTASONE) 10 MG tablet Take 20-60 mg by mouth See admin instructions. Days 1-4: Two tablets before breakfast, one after lunch, one after dinner, and two at bedtime. Days 5-8: One tablet before breakfast, one after lunch, one after dinner, and one at bedtime Days 9-12: One tablet before breakfast and one at bedtime   Yes Historical Provider, MD    Review of Systems  Vital Signs: BP 131/48 mmHg  Pulse 65  Temp(Src) 97.9 F (36.6 C) (  Oral)  Resp 17  Ht  (1.651 m)  Wt 70.9 kg (156 lb 4.9 oz)  BMI 26.01 kg/m2  SpO2 94%  Physical Exam  Constitutional: She appears well-developed.  HENT:  A/O Appropriate Smile not equal (known- previous) L face droop and eye droop (known- previous) Tongue midline   Musculoskeletal:  L arm site clean an dry NT no bleeding  no hematoma 2+ pulse  Moves all 4s Good strength and sensation B  Nursing note and vitals reviewed.   Imaging: No results found.  Labs:  CBC:  Recent Labs  11/17/14 0857  01/20/15 0959 01/25/15 0653 01/25/15 1350  WBC 5.9 13.0* 10.1 5.6  HGB 11.2* 13.4 12.5 11.5*  HCT 34.2* 38.9 36.5 33.5*  PLT 159 206 150 123*    COAGS:  Recent Labs  09/24/14 0736 09/24/14 1050 11/17/14 0857 01/20/15 0959  INR 0.94 1.02 1.00 1.01  APTT 20* 38* 33 25    BMP:  Recent Labs  09/24/14 1050 11/17/14 0857 01/20/15 0959 01/26/15 0500  NA 144 141 140 142  K 3.8 3.9 4.6 2.8*  CL 105 102 104 112  CO2 GLUCOSE 113* 114* 136* 106*  BUN CALCIUM 8.6 9.5 9.7 7.4*  CREATININE 0.76 0.74 0.95 0.72  GFRNONAA 83* 84* 59* 84*  GFRAA >90 >90 68* >90    LIVER FUNCTION TESTS:  Recent Labs  01/20/15 0959  BILITOT 0.9  AST 20  ALT 24  ALKPHOS 65  PROT 6.2  ALBUMIN 3.7    Assessment and Plan:  L Internal carotid artery aneurysm pipeline stent 2/1 Doing well Plan for dc after seen by Dr Corliss Skains K+ 2.2---replace Advance diet Up to chair.    I spent a total of 20 minutes face to face in clinical consultation/evaluation, greater than 50% of which was counseling/coordinating care for L ICA aneurysm stent  Signed: Damontay Alred A 01/26/2015, 8:33 AM

## 2015-01-26 NOTE — Progress Notes (Signed)
Patient is alert oriented and not in any pain. Patient ate lunch and did not complain of nausea or vomiting. Patient walked around the unit three times today and is being discharged home. Daughter is at bedside all of patients belongings are with patient and daughter.

## 2015-01-26 NOTE — Progress Notes (Signed)
ANTICOAGULATION CONSULT NOTE - Follow Up Consult  Pharmacy Consult for heparin Indication: s/p ICA stent  Labs:  Recent Labs  01/25/15 0653 01/25/15 1350 01/25/15 2300  HGB 12.5 11.5*  --   HCT 36.5 33.5*  --   PLT 150 123*  --   HEPARINUNFRC  --  0.51 0.50      Assessment: 73yo female supratherapeutic on heparin with initial dosing s/p ICA stent.  Goal of Therapy:  Heparin level 0.1-0.25 units/ml   Plan:  Will decrease heparin gtt by 3 units/kg/hr to 500 units/hr until off at 0700.  Vernard GamblesVeronda Cerria Randhawa, PharmD, BCPS  01/26/2015,12:24 AM

## 2015-02-17 ENCOUNTER — Other Ambulatory Visit: Payer: Self-pay | Admitting: Radiology

## 2015-02-17 ENCOUNTER — Ambulatory Visit (HOSPITAL_COMMUNITY)
Admission: RE | Admit: 2015-02-17 | Discharge: 2015-02-17 | Disposition: A | Discharge status: Home / Self Care | Payer: Medicare Other | Source: Ambulatory Visit | Attending: Interventional Radiology | Admitting: Interventional Radiology

## 2015-02-17 DIAGNOSIS — Z8673 Personal history of transient ischemic attack (TIA), and cerebral infarction without residual deficits: Secondary | ICD-10-CM | POA: Diagnosis not present

## 2015-02-17 DIAGNOSIS — Z79899 Other long term (current) drug therapy: Secondary | ICD-10-CM | POA: Insufficient documentation

## 2015-02-17 DIAGNOSIS — I447 Left bundle-branch block, unspecified: Secondary | ICD-10-CM | POA: Diagnosis not present

## 2015-02-17 DIAGNOSIS — I2510 Atherosclerotic heart disease of native coronary artery without angina pectoris: Secondary | ICD-10-CM | POA: Insufficient documentation

## 2015-02-17 DIAGNOSIS — E119 Type 2 diabetes mellitus without complications: Secondary | ICD-10-CM | POA: Diagnosis not present

## 2015-02-17 DIAGNOSIS — I739 Peripheral vascular disease, unspecified: Secondary | ICD-10-CM | POA: Insufficient documentation

## 2015-02-17 DIAGNOSIS — Z7902 Long term (current) use of antithrombotics/antiplatelets: Secondary | ICD-10-CM | POA: Diagnosis not present

## 2015-02-17 DIAGNOSIS — Z7982 Long term (current) use of aspirin: Secondary | ICD-10-CM | POA: Diagnosis not present

## 2015-02-17 DIAGNOSIS — Z87891 Personal history of nicotine dependence: Secondary | ICD-10-CM | POA: Diagnosis not present

## 2015-02-17 DIAGNOSIS — I10 Essential (primary) hypertension: Secondary | ICD-10-CM | POA: Insufficient documentation

## 2015-02-17 DIAGNOSIS — K219 Gastro-esophageal reflux disease without esophagitis: Secondary | ICD-10-CM | POA: Diagnosis not present

## 2015-02-17 DIAGNOSIS — Z48812 Encounter for surgical aftercare following surgery on the circulatory system: Secondary | ICD-10-CM | POA: Insufficient documentation

## 2015-02-17 DIAGNOSIS — I720 Aneurysm of carotid artery: Secondary | ICD-10-CM | POA: Diagnosis not present

## 2015-02-17 DIAGNOSIS — I6522 Occlusion and stenosis of left carotid artery: Secondary | ICD-10-CM | POA: Diagnosis not present

## 2015-02-17 DIAGNOSIS — A809 Acute poliomyelitis, unspecified: Secondary | ICD-10-CM

## 2015-02-17 DIAGNOSIS — I671 Cerebral aneurysm, nonruptured: Secondary | ICD-10-CM

## 2015-02-17 LAB — PLATELET INHIBITION P2Y12: Platelet Function  P2Y12: 21 [PRU] — ABNORMAL LOW (ref 194–418)

## 2015-11-11 IMAGING — XA IR TRANSCATH EMBOLIZATION
2 of 5 series · 9 of 24 positions shown · IV contrast (IODINE)
Comparison: none

CLINICAL DATA: Severe headaches. Extensive extracranial and
intracranial atherosclerotic disease. Left internal carotid artery
intracranial aneurysm.

[Series 2: fl neuro · 1 of 1 slices shown]
[im 1/1]
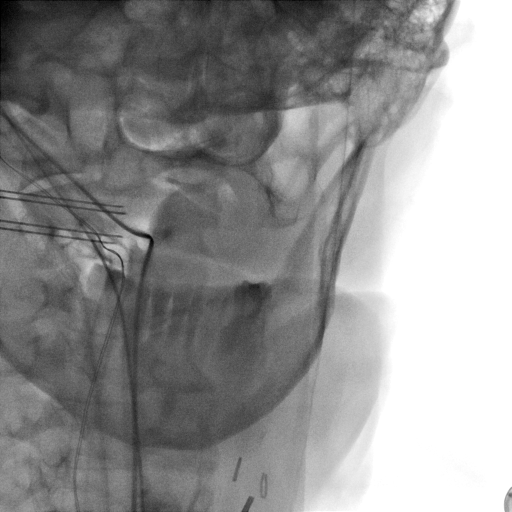

[Series 300: ir transcath/emboliz · 8 of 197 slices shown]
[im 1/197]
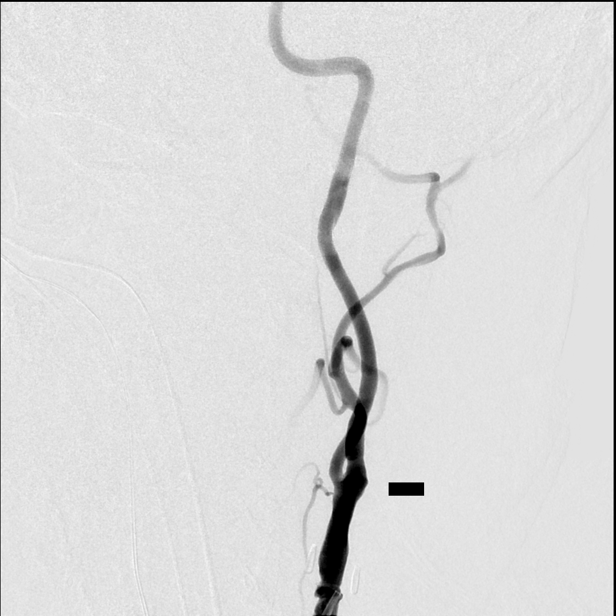
[im 21/197]
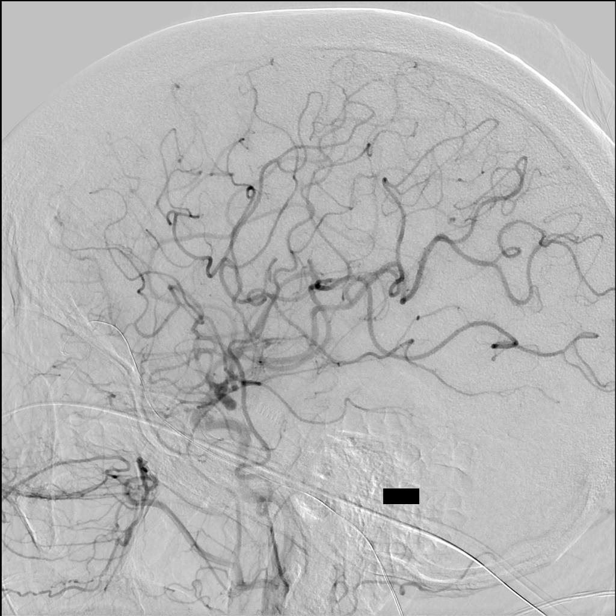
[im 52/197]
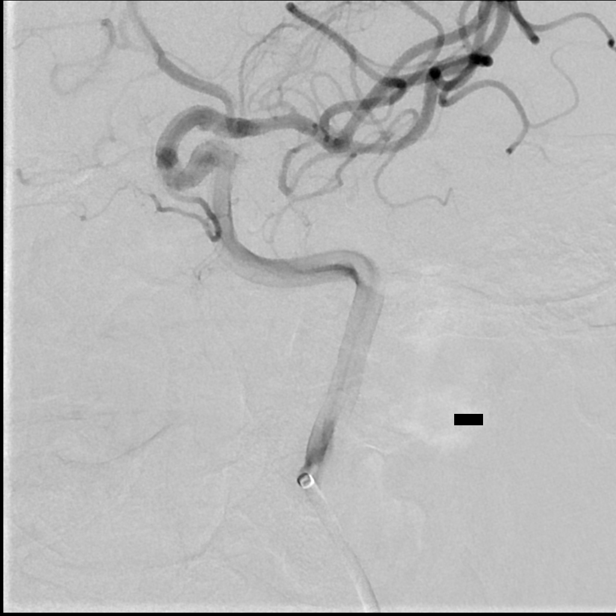
[im 83/197]
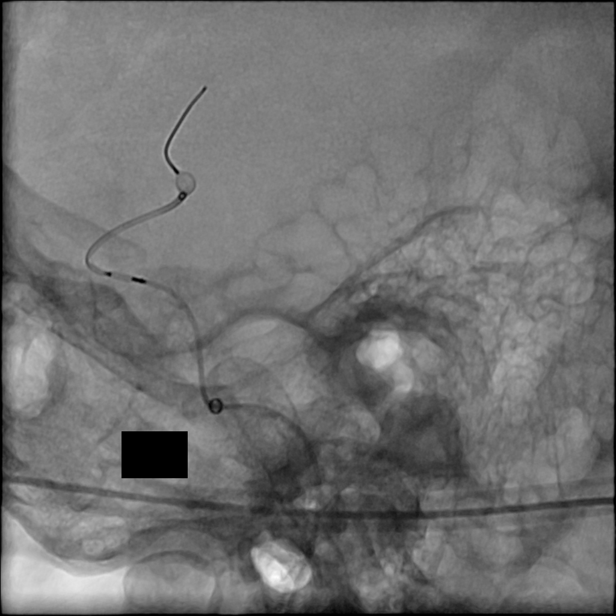
[im 104/197]
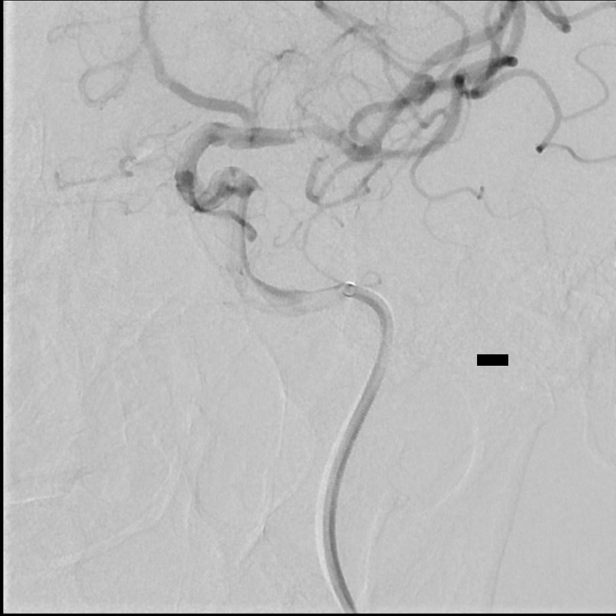
[im 135/197]
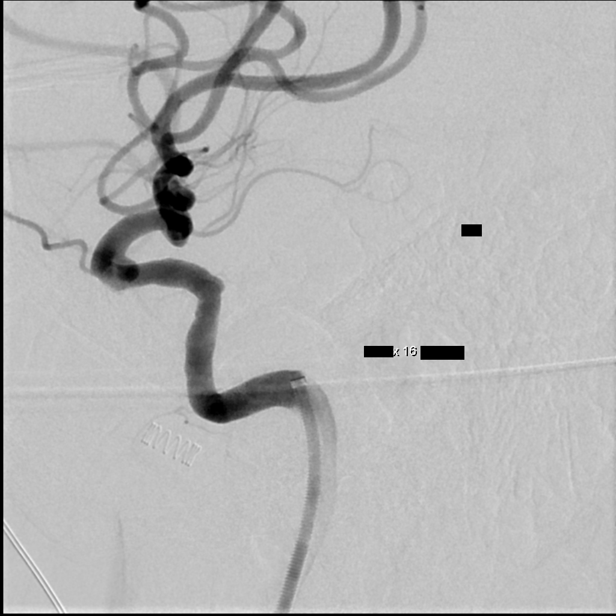
[im 166/197]
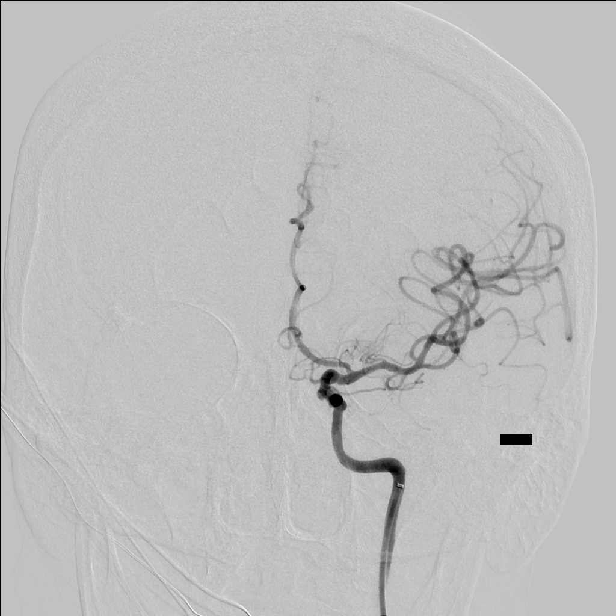
[im 186/197]
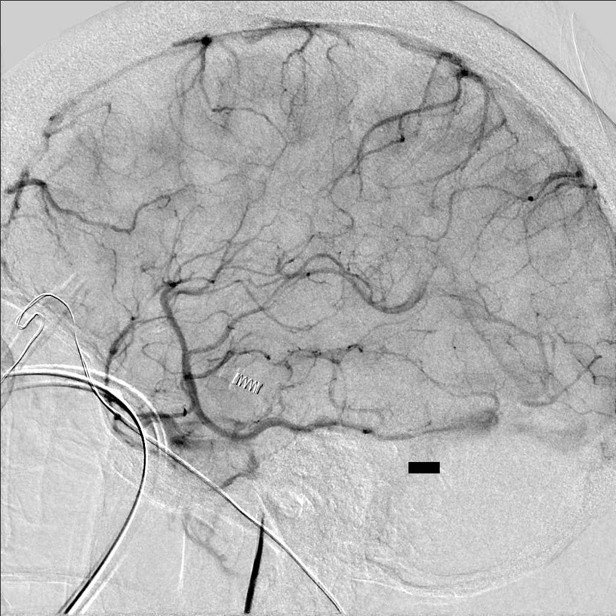

[9 of 24 positions shown; findings below may reference images not displayed]

EXAM:
TRANSCATHETER THERAPY EMBOLIZATION OF THE LEFT INTERNAL CAROTID
ARTERY INTRACRANIAL SUPERIOR HYPOPHYSEAL REGION ANEURYSM USING
PIPELINE FLOW DIVERTER.

ANESTHESIA/SEDATION:
General anesthesia.

MEDICATIONS:
As per general anesthesia.

CONTRAST:  65 OMNIPAQUE IOHEXOL 300 MG/ML  SOLN

PROCEDURE:
Following a full explanation of the procedure along with the
potential associated complications, an informed witnessed consent
was obtained.

The patient was put under general anesthesia by the [REDACTED] at [HOSPITAL].

The left cubital fossa was prepped and draped in the usual sterile
fashion. Thereafter using ultrasound guidance and a modified
Seldinger technique and a micropuncture set, access into the left
brachial artery was obtained without any difficulty.

Over a 0.035 inch guidewire, a 5 French Pinnacle sheath was then
inserted. This was then connected to continuous heparinized saline
infusion.

Over the J guidewire, a 5 French JB1 catheter was then advanced and
positioned just proximal to the left common carotid artery
bifurcation. Arteriograms were then perfor[REDACTED]ed over the
carotid bifurcation and intracranially. The patient tolerated the
procedure well.

COMPLICATIONS:  None immediate.
FINDINGS: The left common carotid bifurcation demonstrates the left external
carotid artery to be mildly narrowed at its origin. Its branches are
normally opacified.

The left internal carotid artery at the bulb demonstrates a shallow
smooth plaque without associated ulcerations or stenosis by the
NASCET criteria.

More distally the left internal carotid artery is seen to opacify
normally to the cranial skull base. The petrous, cavernous and the
supraclinoid segments are widely patent.

A saccular aneurysm is seen to arise from the posterior wall of the
left internal carotid artery projecting posteriorly and medially
consistent with a superior hypophyseal region intracranial aneurysm.
The origin of the neck of the aneurysm is at the level of the
ophthalmic artery.

The aneurysm measures approximately 5 mm x 3.5 mm.

At the level of the posterior communicating artery there is a small
pouch. Additionally there is dysplastic mild dilatation of the
petrous cavernous junction associated with a small outpouching from
the posterior aspect. The left middle cerebral artery and the left
anterior cerebral artery opacify normally into the capillary and
venous phases.

Opacification of the anterior choroidal artery is also seen.

ENDOVASCULAR TREATMENT OF THE LEFT INTERNAL CAROTID ARTERY
INTRACRANIAL SUPERIOR HYPOPHYSEAL REGION ANEURYSM, WITH ASSOCIATED
DYSPLASIA OF THE PROXIMAL LEFT ICA USING THE PIPELINE FLOW DIVERTER
DEVICE.

The diagnostic JB1 catheter in the left common carotid artery was
then exchanged over a 0.035 inch 300 cm Rosen guidewire for a 35 cm
5 French neurovascular sheath using biplane roadmap technique and
constant fluoroscopic guidance. Good aspiration was obtained from
the hub of the neurovascular sheath. Gentle contrast injection
demonstrated no evidence of spasms, dissections or of intraluminal
filling defects.

This was then connected to continuous heparinized saline infusion.

Over the Rosen exchange guidewire, using biplane roadmap technique
and constant fluoroscopic guidance, a 105 cm 5 French Navien guide
catheter was then advanced and positioned just proximal to the left
common carotid bifurcation. The guidewire was removed. Good
aspiration was obtained from the hub of the 5 French guide catheter.
A gentle constant injection demonstrated no evidence of spasms,
dissections or of intraluminal filling defects.

Over a 0.035 inch Roadrunner guidewire, using biplane roadmap
technique, this was then advanced to the horizontal segment of the
petrous portion of the left internal carotid artery. The guidewire
was removed. Good aspiration was obtained from the hub of the Quirijn
guide catheter.

Again a gentle contrast injection demonstrated no evidence of
spasms, dissections or of intraluminal filling defects.

A control arteriogram performed through the 5 French guide catheter
in the left internal carotid artery demonstrated no change in the
intracranial circulation.

Measurements were then performed of the left internal carotid artery
just proximal to the left posterior communicating artery and more
proximally in the mid cavernous segment.

The length of the dysplastic vessel was also noted.

It was elected to proceed with placement of a 3.25 x 16 mm PED.

At this time in a coaxial manner and with constant heparinized
saline infusion using biplane roadmap technique and constant
fluoroscopic guidance, a Marksman 135 cm micro catheter was then
advanced over a 0.0141 Softip Synchro micro guidewire to the distal
end of the 5 French guide catheter.

With the micro guidewire leading with the J-tip configuration, the
combination was then navigated using a torque device into the left
middle cerebral artery inferior division, the left middle cerebral
artery followed by the micro catheter. The micro guidewire was then
removed. Good aspiration was obtained from the hub of the Marksman
micro catheter. This was then connected to continuous heparinized
saline infusion.

The 3.25 mm x 16 mm pipeline endovascular device was then prepped
and purged with heparinized saline infusion at the hub of the
Tuohy-Borst. Using biplane roadmap technique and constant
fluoroscopic guidance, in a coaxial manner and with constant
heparinized saline infusion, the pipeline endovascular device was
then advanced to the distal aspect of the micro catheter in the
inferior division of the left middle cerebral artery.

The entire system was then straightened.

The O ring on the delivery micro guidewire and the delivery micro
catheter were then loosened. With slight forward gentle traction
with the right hand on the delivery micro guidewire, with the left
hand approximately 8 mm of the device were then unsheathed in the
left M1 segment.

At this time with slight forward motion with the micro catheter,
exposed device was fully opened.

The entire system was then retrieved such that the opened portion of
the distal aspect of the pipeline endovascular device was then just
proximal to the left posterior communicating artery.

From here on out, the rest of the device was then deployed by
advancing the micro catheter intermittently to establish close
contact with the parent vessel.

Using the maneuver as described, the entire device was then deployed
with its proximal portion being deployed just proximal to the small
outpouching at the petrous cavernous junction.

The wire was maintained in the left M1 segment throughout the
procedure.

A control arteriogram performed through the 5 French Navien guide
catheter demonstrated excellent apposition and deployment and
opening of the pipeline endovascular device distally and proximally.

Stasis within the aneurysm was already noticeable.

Using biplane roadmap technique and constant fluoroscopic guidance,
the micro catheter was then gently advanced through the device into
the proximal left M1 segment. The wire was then captured by rotating
the torque device. This was then left connected to continuous
heparinized saline infusion.

An arteriogram performed through the 5 French guide catheter at 15
minutes demonstrated excellent apposition without intraluminal
filling defects.

The micro catheter was then gently retrieved and removed. Control
arteriograms were then performed at 30 and 40 minutes post PED
deployment. These continued to demonstrate excellent flow through
the deployed device, without any intracranial filling defects.

The 5 French Navien guide catheter was then retrieved and removed.
Throughout the procedure the patient's ACT was maintained in the
region of 200 seconds. No angiographic filling defects or
extravasation was seen any time during the procedure.

The patient's hemodynamic and neurological status remained stable as
well.

The 5 French sheath was then removed at the brachial entry site in
the left cubital fossa. Pressure was maintained for approximately 30
minutes without evidence of a hematoma. The radial pulse appeared
intact 2+ at this time.

A pressure dressing was then applied at the site of puncture
followed by the placement of an arm board to immobilize the left
elbow joint.

The patient's general anesthetic was then reversed and the patient
was then extubated without difficulty. Upon recovery the patient
demonstrated no new neurological signs or symptoms. She complained
of a [DATE] headache overlying the left medial orbital region which
responded to Fentanyl.

Hemodynamically and neurologically she remained unchanged.

The patient was then transported to the neuro ICU to continue on IV
heparin, close monitoring of her blood pressure, and for close
neurological observations.

Toward the end of the day, the patient was fully awake and alert
without new neurological symptoms or signs. She complained of no
nausea, vomiting or visual symptoms.

The overnight stay was unremarkable. The heparin was stopped the
following morning, Cardene was also weaned off. The patient was
switched to aspirin 325 mg a day and Plavix 75 mg a day. The patient
was fully up and eating normally.

She was then discharged home under the care of her daughters.
Instructions were given regarding adequate hydration and to continue
taking her aspirin and Plavix. The patient is to avoid lifting
anything above 10 lbs for a couple of weeks. She also has been
advised not to drive. She will return for follow-up appointment in
the clinic in 2 weeks time.

The patient leaves with good understanding and agreement with the
above management plan.
IMPRESSION: Status post endovascular embolization of left internal carotid
artery intracranial superior hypophyseal region aneurysm associated
with dysplastic internal carotid artery proximally using the
pipeline endovascular device as described.

## 2015-12-21 ENCOUNTER — Other Ambulatory Visit (HOSPITAL_COMMUNITY): Payer: Self-pay | Admitting: Interventional Radiology

## 2015-12-21 ENCOUNTER — Telehealth (HOSPITAL_COMMUNITY): Payer: Self-pay

## 2015-12-21 DIAGNOSIS — I729 Aneurysm of unspecified site: Secondary | ICD-10-CM

## 2015-12-21 NOTE — Telephone Encounter (Signed)
Called to schedule f/u MRI/MRA, left message for pt to return call. AW 

## 2016-01-27 ENCOUNTER — Telehealth (HOSPITAL_COMMUNITY): Payer: Self-pay

## 2016-01-27 NOTE — Telephone Encounter (Signed)
Called to schedule a f/u MRI, left a message for pt to return call. AW

## 2016-03-14 ENCOUNTER — Ambulatory Visit (HOSPITAL_COMMUNITY): Payer: Medicare Other

## 2016-03-16 ENCOUNTER — Ambulatory Visit (HOSPITAL_COMMUNITY): Admission: RE | Admit: 2016-03-16 | Payer: Medicare Other | Source: Ambulatory Visit

## 2016-03-16 ENCOUNTER — Ambulatory Visit (HOSPITAL_COMMUNITY)
Admission: RE | Admit: 2016-03-16 | Discharge: 2016-03-16 | Disposition: A | Discharge status: Home / Self Care | Payer: Medicare Other | Source: Ambulatory Visit | Attending: Interventional Radiology | Admitting: Interventional Radiology

## 2016-03-16 DIAGNOSIS — I729 Aneurysm of unspecified site: Secondary | ICD-10-CM | POA: Diagnosis not present

## 2016-03-16 DIAGNOSIS — G319 Degenerative disease of nervous system, unspecified: Secondary | ICD-10-CM | POA: Insufficient documentation

## 2016-03-16 LAB — CREATININE, SERUM
Creatinine, Ser: 1.08 mg/dL — ABNORMAL HIGH (ref 0.44–1.00)
GFR calc Af Amer: 58 mL/min — ABNORMAL LOW (ref >60)
GFR calc non Af Amer: 50 mL/min — ABNORMAL LOW (ref >60)

## 2016-03-16 MED ORDER — GADOBENATE DIMEGLUMINE 529 MG/ML IV SOLN
15.0000 mL | Freq: Once | INTRAVENOUS | Status: AC | PRN
  Administered 2016-03-16: 15 mL via INTRAVENOUS

## 2016-04-06 ENCOUNTER — Ambulatory Visit (HOSPITAL_COMMUNITY): Admission: RE | Admit: 2016-04-06 | Payer: Medicare Other | Source: Ambulatory Visit

## 2016-04-21 ENCOUNTER — Ambulatory Visit (HOSPITAL_COMMUNITY)
Admission: RE | Admit: 2016-04-21 | Discharge: 2016-04-21 | Disposition: A | Discharge status: Home / Self Care | Payer: Medicare Other | Source: Ambulatory Visit | Attending: Interventional Radiology | Admitting: Interventional Radiology

## 2016-04-21 DIAGNOSIS — I671 Cerebral aneurysm, nonruptured: Secondary | ICD-10-CM | POA: Insufficient documentation

## 2016-04-21 DIAGNOSIS — I672 Cerebral atherosclerosis: Secondary | ICD-10-CM | POA: Diagnosis not present

## 2016-04-21 DIAGNOSIS — I729 Aneurysm of unspecified site: Secondary | ICD-10-CM | POA: Diagnosis present

## 2016-04-25 ENCOUNTER — Telehealth (HOSPITAL_COMMUNITY): Payer: Self-pay

## 2016-04-25 NOTE — Telephone Encounter (Signed)
Pt agreed to f/u in 6 months with a MRI/MRA. AW 

## 2016-10-03 ENCOUNTER — Telehealth (HOSPITAL_COMMUNITY): Payer: Self-pay

## 2016-10-03 NOTE — Telephone Encounter (Signed)
Called to schedule f/u MRI, no answer, no vm. Will call back later to schedule. AW

## 2016-10-24 ENCOUNTER — Telehealth (HOSPITAL_COMMUNITY): Payer: Self-pay

## 2016-10-24 NOTE — Telephone Encounter (Signed)
Left message with pt's son for pt to return call to schedule f/u. AW

## 2016-11-23 ENCOUNTER — Telehealth (HOSPITAL_COMMUNITY): Payer: Self-pay

## 2016-11-23 NOTE — Telephone Encounter (Signed)
Left message for pt to return call. AW 

## 2017-01-08 ENCOUNTER — Other Ambulatory Visit (HOSPITAL_COMMUNITY): Payer: Self-pay | Admitting: Interventional Radiology

## 2017-01-08 DIAGNOSIS — I729 Aneurysm of unspecified site: Secondary | ICD-10-CM

## 2017-01-15 ENCOUNTER — Ambulatory Visit (HOSPITAL_COMMUNITY): Payer: Medicare Other

## 2017-01-15 ENCOUNTER — Other Ambulatory Visit (HOSPITAL_COMMUNITY): Payer: Self-pay | Admitting: Interventional Radiology

## 2017-01-15 ENCOUNTER — Ambulatory Visit (HOSPITAL_COMMUNITY)
Admission: RE | Admit: 2017-01-15 | Discharge: 2017-01-15 | Disposition: A | Discharge status: Home / Self Care | Payer: Medicare Other | Source: Ambulatory Visit | Attending: Interventional Radiology | Admitting: Interventional Radiology

## 2017-01-15 ENCOUNTER — Encounter (HOSPITAL_COMMUNITY): Payer: Self-pay | Admitting: Radiology

## 2017-01-15 DIAGNOSIS — I7090 Unspecified atherosclerosis: Secondary | ICD-10-CM | POA: Insufficient documentation

## 2017-01-15 DIAGNOSIS — I729 Aneurysm of unspecified site: Secondary | ICD-10-CM

## 2017-01-15 DIAGNOSIS — I6782 Cerebral ischemia: Secondary | ICD-10-CM | POA: Insufficient documentation

## 2017-01-15 DIAGNOSIS — I671 Cerebral aneurysm, nonruptured: Secondary | ICD-10-CM | POA: Insufficient documentation

## 2017-01-15 LAB — CREATININE, SERUM
Creatinine, Ser: 1.67 mg/dL — ABNORMAL HIGH (ref 0.44–1.00)
GFR calc Af Amer: 34 mL/min — ABNORMAL LOW (ref >60)
GFR calc non Af Amer: 29 mL/min — ABNORMAL LOW (ref >60)

## 2017-01-22 ENCOUNTER — Telehealth (HOSPITAL_COMMUNITY): Payer: Self-pay

## 2017-01-22 NOTE — Telephone Encounter (Signed)
Left message for pt to return call. AW 

## 2017-01-25 ENCOUNTER — Telehealth (HOSPITAL_COMMUNITY): Payer: Self-pay

## 2017-01-25 NOTE — Telephone Encounter (Signed)
Pt agreed to f/u in 1 yr to schedule. AW

## 2018-01-10 ENCOUNTER — Other Ambulatory Visit (HOSPITAL_COMMUNITY): Payer: Self-pay | Admitting: Interventional Radiology

## 2018-01-10 DIAGNOSIS — I729 Aneurysm of unspecified site: Secondary | ICD-10-CM

## 2018-02-05 ENCOUNTER — Other Ambulatory Visit (HOSPITAL_COMMUNITY): Payer: Self-pay | Admitting: Interventional Radiology

## 2018-02-05 ENCOUNTER — Ambulatory Visit (HOSPITAL_COMMUNITY)
Admission: RE | Admit: 2018-02-05 | Discharge: 2018-02-05 | Disposition: A | Discharge status: Home / Self Care | Payer: Medicare Other | Source: Ambulatory Visit | Attending: Interventional Radiology | Admitting: Interventional Radiology

## 2018-02-05 ENCOUNTER — Ambulatory Visit (HOSPITAL_COMMUNITY): Payer: Medicare Other

## 2018-02-05 DIAGNOSIS — I729 Aneurysm of unspecified site: Secondary | ICD-10-CM

## 2018-02-05 DIAGNOSIS — Z8679 Personal history of other diseases of the circulatory system: Secondary | ICD-10-CM | POA: Insufficient documentation

## 2018-02-05 DIAGNOSIS — R9082 White matter disease, unspecified: Secondary | ICD-10-CM | POA: Diagnosis not present

## 2018-02-05 LAB — POCT I-STAT CREATININE: Creatinine, Ser: 1.6 mg/dL — ABNORMAL HIGH (ref 0.44–1.00)

## 2018-03-19 ENCOUNTER — Telehealth (HOSPITAL_COMMUNITY): Payer: Self-pay | Admitting: *Deleted

## 2018-03-19 NOTE — Telephone Encounter (Signed)
Called and spoke with patient per Dr. Corliss Skainseveshwar patient ca stop taking Plavix,  Patient states that she will check with her cardiologist and vascular doctor to make sure she can stop taking it.  Instructed patient if they are in agreement that she can stop the Plavix she will need to increase her ASA t to 325mg .  Also informed patient that Dr. Corliss Skainseveshwar would like a repeat MRI/MRA in 1 year.   Patient voiced understanding of these instrucions

## 2019-02-04 ENCOUNTER — Other Ambulatory Visit (HOSPITAL_COMMUNITY): Payer: Self-pay | Admitting: Interventional Radiology

## 2019-02-04 DIAGNOSIS — I729 Aneurysm of unspecified site: Secondary | ICD-10-CM

## 2019-02-19 ENCOUNTER — Ambulatory Visit (HOSPITAL_COMMUNITY)
Admission: RE | Admit: 2019-02-19 | Discharge: 2019-02-19 | Disposition: A | Discharge status: Home / Self Care | Payer: Medicare Other | Source: Ambulatory Visit | Attending: Interventional Radiology | Admitting: Interventional Radiology

## 2019-02-19 DIAGNOSIS — I729 Aneurysm of unspecified site: Secondary | ICD-10-CM

## 2019-02-19 LAB — CREATININE, SERUM
Creatinine, Ser: 1.33 mg/dL — ABNORMAL HIGH (ref 0.44–1.00)
GFR calc Af Amer: 45 mL/min — ABNORMAL LOW (ref >60)
GFR calc non Af Amer: 39 mL/min — ABNORMAL LOW (ref >60)

## 2019-02-19 MED ORDER — GADOBUTROL 1 MMOL/ML IV SOLN
6.0000 mL | Freq: Once | INTRAVENOUS | Status: AC | PRN
  Administered 2019-02-19: 6 mL via INTRAVENOUS

## 2019-02-24 ENCOUNTER — Telehealth (HOSPITAL_COMMUNITY): Payer: Self-pay

## 2019-02-24 NOTE — Telephone Encounter (Signed)
Called pt regarding recent mri, no answer, left vm for pt to return call. AW

## 2019-03-03 ENCOUNTER — Telehealth (HOSPITAL_COMMUNITY): Payer: Self-pay

## 2019-03-03 NOTE — Telephone Encounter (Signed)
Pt agreed to f/u in 2 years with mra w/o. AW

## 2021-02-08 ENCOUNTER — Other Ambulatory Visit (HOSPITAL_COMMUNITY): Payer: Self-pay | Admitting: Interventional Radiology

## 2021-02-08 ENCOUNTER — Telehealth (HOSPITAL_COMMUNITY): Payer: Self-pay

## 2021-02-08 DIAGNOSIS — I671 Cerebral aneurysm, nonruptured: Secondary | ICD-10-CM

## 2021-02-08 NOTE — Telephone Encounter (Signed)
Called to schedule f/u mra. Pt will check son's schedule and call back to schedule. AW

## 2021-02-14 ENCOUNTER — Telehealth (HOSPITAL_COMMUNITY): Payer: Self-pay

## 2021-02-14 NOTE — Telephone Encounter (Signed)
Returned pt's call, no answer, left vm. AW  

## 2021-03-16 ENCOUNTER — Other Ambulatory Visit: Payer: Self-pay

## 2021-03-16 ENCOUNTER — Ambulatory Visit (HOSPITAL_COMMUNITY)
Admission: RE | Admit: 2021-03-16 | Discharge: 2021-03-16 | Disposition: A | Discharge status: Home / Self Care | Payer: Medicare Other | Source: Ambulatory Visit | Attending: Interventional Radiology | Admitting: Interventional Radiology

## 2021-03-16 ENCOUNTER — Ambulatory Visit (HOSPITAL_COMMUNITY)
Admission: RE | Admit: 2021-03-16 | Discharge: 2021-03-16 | Disposition: A | Discharge status: Home / Self Care | Payer: Medicare Other | Source: Ambulatory Visit | Attending: Student | Admitting: Student

## 2021-03-16 DIAGNOSIS — Z950 Presence of cardiac pacemaker: Secondary | ICD-10-CM | POA: Insufficient documentation

## 2021-03-16 DIAGNOSIS — I671 Cerebral aneurysm, nonruptured: Secondary | ICD-10-CM

## 2021-03-16 NOTE — Progress Notes (Signed)
Patient here today for MRA brain wo contrast. Patient has medtronic device and did not mention to scheduler that she had a pacemaker. Patient received chest xray to r/o abandoned leads. [employer] sent to Mike-medtronic rep and Andy-Cardiology PA. Orders received for DOO 75. Will re-program once scan is completed.

## 2021-03-16 NOTE — Progress Notes (Signed)
Informed of MRI for today.   Device system confirmed to be MRI conditional, with implant date > 6 weeks ago and no evidence of abandoned or epicardial leads in review of most recent CXR Interrogation from today reviewed, pt is currently AP-VP at 50 bpm Change device settings for MRI to DOO at 75 bpm  Tachy-therapies to off if applicable.  Program device back to pre-MRI settings after completion of exam.  Luane School  03/16/2021 1:59 PM

## 2021-03-21 ENCOUNTER — Other Ambulatory Visit (HOSPITAL_COMMUNITY): Payer: Self-pay | Admitting: Interventional Radiology

## 2021-03-21 DIAGNOSIS — I771 Stricture of artery: Secondary | ICD-10-CM

## 2021-03-21 DIAGNOSIS — I671 Cerebral aneurysm, nonruptured: Secondary | ICD-10-CM

## 2021-04-01 ENCOUNTER — Other Ambulatory Visit: Payer: Self-pay

## 2021-04-01 ENCOUNTER — Ambulatory Visit (HOSPITAL_COMMUNITY)
Admission: RE | Admit: 2021-04-01 | Discharge: 2021-04-01 | Disposition: A | Discharge status: Home / Self Care | Payer: Medicare Other | Source: Ambulatory Visit | Attending: Interventional Radiology | Admitting: Interventional Radiology

## 2021-04-01 DIAGNOSIS — I671 Cerebral aneurysm, nonruptured: Secondary | ICD-10-CM

## 2021-04-01 DIAGNOSIS — I771 Stricture of artery: Secondary | ICD-10-CM

## 2021-04-28 HISTORY — PX: IR RADIOLOGIST EVAL & MGMT: IMG5224

## 2022-06-20 ENCOUNTER — Telehealth (HOSPITAL_COMMUNITY): Payer: Self-pay

## 2022-06-20 NOTE — Telephone Encounter (Signed)
Called to schedule mra, no answer, left vm. AW  ?

## 2022-06-28 ENCOUNTER — Telehealth (HOSPITAL_COMMUNITY): Payer: Self-pay

## 2022-06-28 ENCOUNTER — Other Ambulatory Visit (HOSPITAL_COMMUNITY): Payer: Self-pay | Admitting: Interventional Radiology

## 2022-06-28 DIAGNOSIS — I671 Cerebral aneurysm, nonruptured: Secondary | ICD-10-CM

## 2022-06-28 NOTE — Telephone Encounter (Signed)
Called to schedule mra, no answer, left vm. AW  ?

## 2022-07-27 ENCOUNTER — Telehealth (HOSPITAL_COMMUNITY): Payer: Self-pay

## 2022-07-27 NOTE — Telephone Encounter (Signed)
Called to give pt appt info for mra, no answer, left vm. AW

## 2022-08-14 ENCOUNTER — Ambulatory Visit (HOSPITAL_COMMUNITY): Payer: Medicare Other | Attending: Interventional Radiology

## 2022-09-20 ENCOUNTER — Ambulatory Visit (HOSPITAL_COMMUNITY)
Admission: RE | Admit: 2022-09-20 | Discharge: 2022-09-20 | Disposition: A | Discharge status: Home / Self Care | Payer: Medicare Other | Source: Ambulatory Visit | Attending: Interventional Radiology | Admitting: Interventional Radiology

## 2022-09-20 DIAGNOSIS — I671 Cerebral aneurysm, nonruptured: Secondary | ICD-10-CM | POA: Insufficient documentation

## 2022-09-26 ENCOUNTER — Other Ambulatory Visit (HOSPITAL_COMMUNITY): Payer: Self-pay | Admitting: Interventional Radiology

## 2022-09-26 DIAGNOSIS — I671 Cerebral aneurysm, nonruptured: Secondary | ICD-10-CM

## 2022-10-05 ENCOUNTER — Ambulatory Visit (HOSPITAL_COMMUNITY): Admission: RE | Admit: 2022-10-05 | Payer: Medicare Other | Source: Ambulatory Visit

## 2022-12-06 ENCOUNTER — Ambulatory Visit (HOSPITAL_COMMUNITY)
Admission: RE | Admit: 2022-12-06 | Discharge: 2022-12-06 | Disposition: A | Discharge status: Home / Self Care | Payer: Medicare Other | Source: Ambulatory Visit | Attending: Interventional Radiology | Admitting: Interventional Radiology

## 2022-12-15 ENCOUNTER — Telehealth (HOSPITAL_COMMUNITY): Payer: Self-pay | Admitting: Radiology

## 2022-12-15 ENCOUNTER — Ambulatory Visit (HOSPITAL_COMMUNITY)
Admission: RE | Admit: 2022-12-15 | Discharge: 2022-12-15 | Disposition: A | Discharge status: Home / Self Care | Payer: Medicare Other | Source: Ambulatory Visit | Attending: Interventional Radiology | Admitting: Interventional Radiology

## 2022-12-15 NOTE — Telephone Encounter (Signed)
Called pt's daughter to see if they can do a phone visit today instead of in-person visit. Left VM. JM

## 2022-12-19 ENCOUNTER — Telehealth (HOSPITAL_COMMUNITY): Payer: Self-pay

## 2022-12-19 NOTE — Telephone Encounter (Signed)
Called pt regarding recent convo with Dr. Corliss Skains, no answer, left vm. AW

## 2023-01-02 ENCOUNTER — Other Ambulatory Visit (HOSPITAL_COMMUNITY): Payer: Medicare Other

## 2023-01-03 ENCOUNTER — Other Ambulatory Visit: Payer: Self-pay | Admitting: Radiology

## 2023-01-03 ENCOUNTER — Other Ambulatory Visit: Payer: Self-pay | Admitting: Internal Medicine

## 2023-01-03 DIAGNOSIS — I771 Stricture of artery: Secondary | ICD-10-CM

## 2023-01-04 ENCOUNTER — Ambulatory Visit (HOSPITAL_COMMUNITY)
Admission: RE | Admit: 2023-01-04 | Discharge: 2023-01-04 | Disposition: A | Discharge status: Home / Self Care | Payer: 59 | Source: Ambulatory Visit | Attending: Interventional Radiology | Admitting: Interventional Radiology

## 2023-01-04 ENCOUNTER — Telehealth (HOSPITAL_COMMUNITY): Payer: Self-pay

## 2023-01-04 NOTE — Telephone Encounter (Signed)
Per Dr. Estanislado Pandy, pt only need angiogram if she is symptomatic. Spoke to pt and daughter, she is not symptomatic at this time. Will send a request to insurance for a f/u mra. They agreed with this plan. AW

## 2023-01-17 ENCOUNTER — Ambulatory Visit (HOSPITAL_COMMUNITY)
Admission: RE | Admit: 2023-01-17 | Discharge: 2023-01-17 | Disposition: A | Discharge status: Home / Self Care | Payer: 59 | Source: Ambulatory Visit | Attending: Interventional Radiology | Admitting: Interventional Radiology

## 2023-01-17 ENCOUNTER — Telehealth (HOSPITAL_COMMUNITY): Payer: Self-pay

## 2023-01-17 DIAGNOSIS — I671 Cerebral aneurysm, nonruptured: Secondary | ICD-10-CM | POA: Insufficient documentation

## 2023-01-17 NOTE — Progress Notes (Signed)
Patient here today at [facility] for MR angio head wo contrast. Patient has Medtronic device. CLE sent. Ivan-rep at the bedside to program patient. DOO 70. Will re-program once scan is completed.

## 2023-01-17 NOTE — Telephone Encounter (Signed)
Pt has a medtronic device. Schedule in the 1pm pacemaker slot the next time she has an MRI. AB

## 2023-01-25 ENCOUNTER — Telehealth (HOSPITAL_COMMUNITY): Payer: Self-pay

## 2023-01-25 NOTE — Telephone Encounter (Signed)
Called pt regarding recent imaging, no answer, left vm. AB  

## 2023-01-29 ENCOUNTER — Telehealth (HOSPITAL_COMMUNITY): Payer: Self-pay

## 2023-01-29 NOTE — Telephone Encounter (Signed)
Pt agreed to f/u in 6 months with an MRA. AB

## 2023-06-25 ENCOUNTER — Telehealth (HOSPITAL_COMMUNITY): Payer: Self-pay

## 2023-06-25 ENCOUNTER — Other Ambulatory Visit (HOSPITAL_COMMUNITY): Payer: Self-pay | Admitting: Interventional Radiology

## 2023-06-25 DIAGNOSIS — I671 Cerebral aneurysm, nonruptured: Secondary | ICD-10-CM

## 2023-06-25 NOTE — Telephone Encounter (Signed)
Called to schedule mra, no answer, left vm. AB  

## 2023-09-19 ENCOUNTER — Ambulatory Visit (HOSPITAL_COMMUNITY)
Admission: RE | Admit: 2023-09-19 | Discharge: 2023-09-19 | Disposition: A | Discharge status: Home / Self Care | Payer: 59 | Source: Ambulatory Visit | Attending: Interventional Radiology | Admitting: Interventional Radiology

## 2023-09-19 DIAGNOSIS — I671 Cerebral aneurysm, nonruptured: Secondary | ICD-10-CM | POA: Insufficient documentation

## 2023-10-15 ENCOUNTER — Telehealth (HOSPITAL_COMMUNITY): Payer: Self-pay

## 2023-10-15 NOTE — Telephone Encounter (Signed)
Called pt regarding recent imaging. She is currently in the hospital. I will try back next week. AB

## 2024-06-21 ENCOUNTER — Encounter (HOSPITAL_COMMUNITY): Payer: Self-pay | Admitting: Interventional Radiology
